# Patient Record
Sex: Female | Born: 1980 | Race: Black or African American | Hispanic: No | Marital: Single | Smoking: Former smoker
Health system: Southern US, Community
[De-identification: ages and names within clinical notes are randomized; demographics above are authoritative.]

## PROBLEM LIST (undated history)

## (undated) DIAGNOSIS — R519 Headache, unspecified: Secondary | ICD-10-CM

## (undated) DIAGNOSIS — R51 Headache: Secondary | ICD-10-CM

## (undated) DIAGNOSIS — Z8659 Personal history of other mental and behavioral disorders: Secondary | ICD-10-CM

## (undated) DIAGNOSIS — Z87442 Personal history of urinary calculi: Secondary | ICD-10-CM

## (undated) DIAGNOSIS — G5601 Carpal tunnel syndrome, right upper limb: Secondary | ICD-10-CM

## (undated) HISTORY — PX: WISDOM TOOTH EXTRACTION: SHX21

---
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
## 1999-01-04 ENCOUNTER — Other Ambulatory Visit: Admission: RE | Admit: 1999-01-04 | Discharge: 1999-01-04 | Payer: Self-pay | Admitting: Family Medicine

## 2002-08-16 ENCOUNTER — Encounter: Payer: Self-pay | Admitting: Family Medicine

## 2002-08-16 ENCOUNTER — Encounter: Admission: RE | Admit: 2002-08-16 | Discharge: 2002-08-16 | Payer: Self-pay | Admitting: Family Medicine

## 2003-07-24 ENCOUNTER — Other Ambulatory Visit: Admission: RE | Admit: 2003-07-24 | Discharge: 2003-07-24 | Payer: Self-pay | Admitting: *Deleted

## 2004-08-05 ENCOUNTER — Other Ambulatory Visit: Admission: RE | Admit: 2004-08-05 | Discharge: 2004-08-05 | Payer: Self-pay | Admitting: *Deleted

## 2005-12-15 ENCOUNTER — Other Ambulatory Visit: Admission: RE | Admit: 2005-12-15 | Discharge: 2005-12-15 | Payer: Self-pay | Admitting: Gynecology

## 2006-01-16 ENCOUNTER — Encounter: Payer: Self-pay | Admitting: Family Medicine

## 2006-05-11 ENCOUNTER — Emergency Department (HOSPITAL_COMMUNITY): Admission: EM | Admit: 2006-05-11 | Discharge: 2006-05-11 | Payer: Self-pay | Admitting: Family Medicine

## 2006-05-18 ENCOUNTER — Ambulatory Visit: Payer: Self-pay | Admitting: Family Medicine

## 2006-06-16 ENCOUNTER — Ambulatory Visit: Payer: Self-pay | Admitting: Family Medicine

## 2006-06-30 ENCOUNTER — Ambulatory Visit: Payer: Self-pay | Admitting: Family Medicine

## 2006-08-29 ENCOUNTER — Emergency Department (HOSPITAL_COMMUNITY): Admission: EM | Admit: 2006-08-29 | Discharge: 2006-08-29 | Payer: Self-pay | Admitting: Family Medicine

## 2006-12-21 ENCOUNTER — Other Ambulatory Visit: Admission: RE | Admit: 2006-12-21 | Discharge: 2006-12-21 | Payer: Self-pay | Admitting: Gynecology

## 2007-01-03 ENCOUNTER — Encounter: Payer: Self-pay | Admitting: Family Medicine

## 2007-04-10 ENCOUNTER — Emergency Department (HOSPITAL_COMMUNITY): Admission: EM | Admit: 2007-04-10 | Discharge: 2007-04-10 | Payer: Self-pay | Admitting: Psychology

## 2007-05-15 ENCOUNTER — Encounter: Payer: Self-pay | Admitting: Family Medicine

## 2007-05-15 ENCOUNTER — Ambulatory Visit: Payer: Self-pay | Admitting: Family Medicine

## 2007-05-15 DIAGNOSIS — F329 Major depressive disorder, single episode, unspecified: Secondary | ICD-10-CM

## 2007-05-15 DIAGNOSIS — F32A Depression, unspecified: Secondary | ICD-10-CM | POA: Insufficient documentation

## 2007-05-15 DIAGNOSIS — E042 Nontoxic multinodular goiter: Secondary | ICD-10-CM | POA: Insufficient documentation

## 2007-05-15 DIAGNOSIS — E079 Disorder of thyroid, unspecified: Secondary | ICD-10-CM | POA: Insufficient documentation

## 2007-05-23 ENCOUNTER — Telehealth: Payer: Self-pay | Admitting: Family Medicine

## 2007-05-23 ENCOUNTER — Encounter: Payer: Self-pay | Admitting: Family Medicine

## 2007-05-31 ENCOUNTER — Ambulatory Visit: Payer: Self-pay | Admitting: Family Medicine

## 2007-06-08 ENCOUNTER — Telehealth: Payer: Self-pay | Admitting: Family Medicine

## 2007-06-13 ENCOUNTER — Encounter: Payer: Self-pay | Admitting: Family Medicine

## 2007-06-15 ENCOUNTER — Ambulatory Visit: Payer: Self-pay | Admitting: Family Medicine

## 2007-06-19 LAB — CONVERTED CEMR LAB: Free T4: 0.9 ng/dL (ref 0.6–1.6)

## 2007-06-21 ENCOUNTER — Encounter: Payer: Self-pay | Admitting: Family Medicine

## 2007-06-29 ENCOUNTER — Ambulatory Visit: Payer: Self-pay | Admitting: Family Medicine

## 2007-07-16 ENCOUNTER — Telehealth: Payer: Self-pay | Admitting: Family Medicine

## 2007-08-02 ENCOUNTER — Ambulatory Visit: Payer: Self-pay | Admitting: Family Medicine

## 2007-08-02 DIAGNOSIS — S93409A Sprain of unspecified ligament of unspecified ankle, initial encounter: Secondary | ICD-10-CM | POA: Insufficient documentation

## 2007-08-23 ENCOUNTER — Emergency Department (HOSPITAL_COMMUNITY): Admission: EM | Admit: 2007-08-23 | Discharge: 2007-08-24 | Payer: Self-pay | Admitting: Emergency Medicine

## 2007-08-27 ENCOUNTER — Ambulatory Visit (HOSPITAL_COMMUNITY): Admission: RE | Admit: 2007-08-27 | Discharge: 2007-08-27 | Payer: Self-pay | Admitting: Urology

## 2007-12-31 ENCOUNTER — Ambulatory Visit: Payer: Self-pay | Admitting: Family Medicine

## 2008-01-07 ENCOUNTER — Telehealth: Payer: Self-pay | Admitting: Family Medicine

## 2008-01-09 ENCOUNTER — Ambulatory Visit: Payer: Self-pay | Admitting: Family Medicine

## 2008-01-24 ENCOUNTER — Ambulatory Visit: Payer: Self-pay | Admitting: Family Medicine

## 2008-02-15 ENCOUNTER — Other Ambulatory Visit: Admission: RE | Admit: 2008-02-15 | Discharge: 2008-02-15 | Payer: Self-pay | Admitting: Gynecology

## 2008-02-15 ENCOUNTER — Ambulatory Visit: Payer: Self-pay | Admitting: Women's Health

## 2008-02-15 ENCOUNTER — Encounter: Payer: Self-pay | Admitting: Women's Health

## 2008-02-22 ENCOUNTER — Ambulatory Visit: Payer: Self-pay | Admitting: Family Medicine

## 2008-10-09 ENCOUNTER — Ambulatory Visit: Payer: Self-pay | Admitting: Women's Health

## 2009-03-21 ENCOUNTER — Inpatient Hospital Stay (HOSPITAL_COMMUNITY): Admission: AD | Admit: 2009-03-21 | Discharge: 2009-03-21 | Payer: Self-pay | Admitting: Obstetrics & Gynecology

## 2009-06-02 ENCOUNTER — Inpatient Hospital Stay (HOSPITAL_COMMUNITY): Admission: AD | Admit: 2009-06-02 | Discharge: 2009-06-02 | Payer: Self-pay | Admitting: Obstetrics and Gynecology

## 2009-06-03 ENCOUNTER — Inpatient Hospital Stay (HOSPITAL_COMMUNITY): Admission: AD | Admit: 2009-06-03 | Discharge: 2009-06-05 | Payer: Self-pay | Admitting: Obstetrics & Gynecology

## 2009-06-03 ENCOUNTER — Inpatient Hospital Stay (HOSPITAL_COMMUNITY): Admission: AD | Admit: 2009-06-03 | Discharge: 2009-06-03 | Payer: Self-pay | Admitting: Obstetrics and Gynecology

## 2009-07-05 ENCOUNTER — Emergency Department (HOSPITAL_COMMUNITY): Admission: EM | Admit: 2009-07-05 | Discharge: 2009-07-05 | Payer: Self-pay | Admitting: Emergency Medicine

## 2009-07-13 ENCOUNTER — Emergency Department (HOSPITAL_COMMUNITY): Admission: EM | Admit: 2009-07-13 | Discharge: 2009-07-13 | Payer: Self-pay | Admitting: Emergency Medicine

## 2010-07-07 LAB — CBC
HCT: 36.1 % (ref 36.0–46.0)
MCHC: 33.6 g/dL (ref 30.0–36.0)
MCV: 85.7 fL (ref 78.0–100.0)
RBC: 4.22 MIL/uL (ref 3.87–5.11)

## 2010-07-07 LAB — RPR: RPR Ser Ql: NONREACTIVE

## 2010-09-03 NOTE — Assessment & Plan Note (Signed)
[facility]                           [facility] OFFICE NOTE   NAME:Smith, PEARLIE NIES                      MRN:          [id number]  DATE:05/18/2006                            DOB:          1980-10-01    CHIEF COMPLAINT:  A 30 year old black female here to establish new  doctor.   HISTORY OF PRESENT ILLNESS:  Ms. Lawler states that she is doing very  well today.  Last week when she called for the appointment, she was  experiencing pinkeye and laryngitis as well as congestion and facial  pain.  She was unable to wait for this appointment, and so was seen  [facility].  At that visit she was diagnosed  with acute  sinusitis and pinkeye.  She was given a course of Z-Pak and Vigamox eye  drops.  She is almost 100% improved now.   She has no current concerns at this point in time.  She states that she  does have a history of thyroid abnormality that was worked up and seen  by an endocrinologist without specific diagnosis for hypo and  hyperthyroidism.  She states that her thyroid hormone seems to vary, but  she is asymptomatic with it.   REVIEW OF SYSTEMS:  Otherwise negative.   PAST MEDICAL HISTORY:  1. Question thyroid disorder.  2. Irritable bowel spastic colon.   HOSPITALIZATION/SURGERIES/PROCEDURES:  None.   ALLERGIES:  PENICILLIN CAUSES SPOTS.   MEDICATIONS:  1. Depo-Provera q. 3 months.  2. Ibuprofen 800 mg p.r.n.   SOCIAL HISTORY:  Occasional tobacco use, 1 to 2 every couple of weeks.  She drinks about 2 times every month.  She denies drug use.  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  She is single, but is in a  longterm monogamous relationship of 1-1/2 years.  She is sexually  active, but uses Depo-Provera for birth control and condoms.  She does  not have children.  She does not get any regular exercise.  She avoids  fast food.  She eats grilled chicken, fruits, vegetables and lots of  water.  She occasionally  has soda.   FAMILY HISTORY:  Father alive at age 19 with hypertension and  prediabetes.  Mother alive at age 68 with fibroids and possible uterine  cancer or precancer changes as well as spastic colon.  No MI in family  before age 11.  She has 2 brothers who are healthy.  No family history  of other diabetes or cancer.   PHYSICAL EXAMINATION:  VITAL SIGNS:  Height 65 inches, weight 173,  making BMI 29.  Blood pressure 110/68.  Pulse 80.  Temperature 97.9.  GENERAL:  Overweight-appearing female, in no apparent distress.  HEENT:  PERRLA.  Extraocular muscles intact.  Oropharynx clear.  Tympanic membranes clear.  Nares clear.  Possible mild diffuse homogenous thyroid enlargement bilaterally.  No  lymphadenopathy.  LUNGS:  Clear to auscultation bilaterally.  No wheezes, rales or  rhonchi.  ABDOMEN:  Soft and non-tender with normoactive bowel sounds.  No  hepatosplenomegaly.  MUSCULOSKELETAL:  Strength 5/5 in upper  and lower extremities.  NEUROLOGIC:  Alert and oriented x3.  Cranial nerves 2 through 12 grossly  intact.   ASSESSMENT AND PLAN:  1. Acute sinusitis and pinkeye, resolved.  2. Complete physical exam.  She is up to date with most of her      prevention.  We discussed routine exercise and a healthy diet.  I      encouraged her to take a multivitamin, calcium and vitamin D daily.      I will obtain records from her previous doctor to look into her      thyroid disorder to determine if followup with labs needs to be      done.  Is also is unclear as to whether she has had cholesterol      panel baseline in the past.     Kerby Nora, MD  Electronically Signed    AB/MedQ  DD: 05/18/2006  DT: 05/18/2006  Job #: [id number]

## 2012-08-17 ENCOUNTER — Encounter: Payer: Self-pay | Admitting: Family Medicine

## 2012-08-17 ENCOUNTER — Ambulatory Visit (INDEPENDENT_AMBULATORY_CARE_PROVIDER_SITE_OTHER): Payer: PRIVATE HEALTH INSURANCE | Admitting: Family Medicine

## 2012-08-17 VITALS — BP 120/72 | HR 82 | Temp 98.7°F | Ht 65.0 in | Wt 211.5 lb

## 2012-08-17 DIAGNOSIS — R079 Chest pain, unspecified: Secondary | ICD-10-CM

## 2012-08-17 DIAGNOSIS — R002 Palpitations: Secondary | ICD-10-CM

## 2012-08-17 DIAGNOSIS — M25551 Pain in right hip: Secondary | ICD-10-CM | POA: Insufficient documentation

## 2012-08-17 DIAGNOSIS — F410 Panic disorder [episodic paroxysmal anxiety] without agoraphobia: Secondary | ICD-10-CM

## 2012-08-17 LAB — CBC WITH DIFFERENTIAL/PLATELET
Eosinophils Absolute: 0.1 10*3/uL (ref 0.0–0.7)
Eosinophils Relative: 1 % (ref 0–5)
HCT: 41 % (ref 36.0–46.0)
Hemoglobin: 13.7 g/dL (ref 12.0–15.0)
Lymphocytes Relative: 36 % (ref 12–46)
Lymphs Abs: 2.8 10*3/uL (ref 0.7–4.0)
MCH: 27.9 pg (ref 26.0–34.0)
MCV: 83.5 fL (ref 78.0–100.0)
Monocytes Absolute: 0.5 10*3/uL (ref 0.1–1.0)
Monocytes Relative: 6 % (ref 3–12)
Platelets: 381 10*3/uL (ref 150–400)
RBC: 4.91 MIL/uL (ref 3.87–5.11)

## 2012-08-17 NOTE — Assessment & Plan Note (Signed)
Poor control. If labs negative consider SSRI for treatment vs anxiolytic for specific panic attacks

## 2012-08-17 NOTE — Assessment & Plan Note (Signed)
Eval for anemia, hyperthyroidism. Decrease caffeine, stress etc.

## 2012-08-17 NOTE — Patient Instructions (Addendum)
Stop by lab on your way out. We will call you for further recommendations. Work on stress reduction and relaxation.  Remove caffeine from diet. Exercise 3-5 times a week.

## 2012-08-17 NOTE — Assessment & Plan Note (Addendum)
EKG: no concerning changes. Cardiac source unlikely.  Panic disorder +/- costochondritis possible.

## 2012-08-17 NOTE — Addendum Note (Signed)
Addended by: Alvina Chou on: 08/17/2012 03:36 PM   Modules accepted: Orders

## 2012-08-17 NOTE — Progress Notes (Signed)
  Subjective:    Patient ID: [name], female    DOB: 05-19-1980, 32 y.o.   MRN: [id number]  HPI  32 year old female with history of depression and anxiety including panic attacks presents to clinic today with worsening panic attacks. She is having panic attacks occuring weekly. They are more intense then in the past in the past 3-4 months. Last one was 4 days ago: she feels shaky heart racing, chest sharp pain and shortness of breath.  She remarks she is concerned about the severe intensity increasing of panic attacks and chest pain.  If she takes deep breaths and lying down.Marland Kitchen Resolves after 30-40 min. The following day she notes chest soreness for a day or two.  Increase in stress in last year with grandmother sick. Has a three year old.    She is currently on no medication. In past she has been on something that she cannot remember the medication.  Occasionally feels shaky.  Family history: GM with CHF. Brother and grave's disease.   Review of Systems  Constitutional: Negative for fever and fatigue.  HENT: Negative for ear pain.   Eyes: Negative for pain.  Respiratory: Negative for chest tightness and shortness of breath.   Cardiovascular: Negative for chest pain, palpitations and leg swelling.  Gastrointestinal: Negative for abdominal pain.  Genitourinary: Negative for dysuria.       Objective:   Physical Exam  Constitutional: Vital signs are normal. She appears well-developed and well-nourished. She is cooperative.  Non-toxic appearance. She does not appear ill. No distress.  HENT:  Head: Normocephalic.  Right Ear: Hearing, tympanic membrane, external ear and ear canal normal. Tympanic membrane is not erythematous, not retracted and not bulging.  Left Ear: Hearing, tympanic membrane, external ear and ear canal normal. Tympanic membrane is not erythematous, not retracted and not bulging.  Nose: No mucosal edema or rhinorrhea. Right sinus exhibits no maxillary  sinus tenderness and no frontal sinus tenderness. Left sinus exhibits no maxillary sinus tenderness and no frontal sinus tenderness.  Mouth/Throat: Uvula is midline, oropharynx is clear and moist and mucous membranes are normal.  Eyes: Conjunctivae, EOM and lids are normal. Pupils are equal, round, and reactive to light. No foreign bodies found.  Neck: Trachea normal and normal range of motion. Neck supple. Carotid bruit is not present. No mass and no thyromegaly present.  Cardiovascular: Normal rate, regular rhythm, S1 normal, S2 normal, normal heart sounds, intact distal pulses and normal pulses.  Exam reveals no gallop and no friction rub.   No murmur heard. Pulmonary/Chest: Effort normal and breath sounds normal. Not tachypneic. No respiratory distress. She has no decreased breath sounds. She has no wheezes. She has no rhonchi. She has no rales. Chest wall is not dull to percussion. She exhibits mass and tenderness. She exhibits no laceration and no crepitus.    Abdominal: Soft. Normal appearance and bowel sounds are normal. There is no tenderness.  Neurological: She is alert.  Skin: Skin is warm, dry and intact. No rash noted.  Psychiatric: Her speech is normal and behavior is normal. Judgment and thought content normal. Her mood appears not anxious. Cognition and memory are normal. She does not exhibit a depressed mood.          Assessment & Plan:

## 2012-08-21 ENCOUNTER — Ambulatory Visit: Payer: Self-pay | Admitting: Family Medicine

## 2012-08-24 ENCOUNTER — Encounter: Payer: Self-pay | Admitting: Family Medicine

## 2012-08-24 ENCOUNTER — Ambulatory Visit (INDEPENDENT_AMBULATORY_CARE_PROVIDER_SITE_OTHER): Payer: PRIVATE HEALTH INSURANCE | Admitting: Family Medicine

## 2012-08-24 VITALS — BP 120/70 | HR 95 | Temp 98.2°F | Ht 65.0 in | Wt 213.8 lb

## 2012-08-24 DIAGNOSIS — E079 Disorder of thyroid, unspecified: Secondary | ICD-10-CM

## 2012-08-24 DIAGNOSIS — R519 Headache, unspecified: Secondary | ICD-10-CM | POA: Insufficient documentation

## 2012-08-24 DIAGNOSIS — R51 Headache: Secondary | ICD-10-CM

## 2012-08-24 DIAGNOSIS — F410 Panic disorder [episodic paroxysmal anxiety] without agoraphobia: Secondary | ICD-10-CM

## 2012-08-24 NOTE — Patient Instructions (Addendum)
Ibuprofen for headache. We will call with lab results.... Stop at lab on way out. Work on stress reduction and relaxation.

## 2012-08-24 NOTE — Assessment & Plan Note (Signed)
Improving with stress reduction and relaxation.

## 2012-08-24 NOTE — Assessment & Plan Note (Signed)
Some qualities of tension headahce and some of migraine.  Nml neuro exam and no red flags other that new headache severe at times.  May be cause by glasses vs thyroid issue.  Can use ibuprofen prn.

## 2012-08-24 NOTE — Assessment & Plan Note (Addendum)
TSH suggests hyperthyroid.Marland KitchenMarland KitchenMay be cause of symptoms. Eval with free t3 and t4 to verify.  Addendum: thyroid tests returned nml. WIll treat with SSRI for mood disorder.

## 2012-08-24 NOTE — Progress Notes (Signed)
Subjective:    Patient ID: [name], female    DOB: 03-08-1981, 32 y.o.   MRN: [id number]  HPI  32 year old female presetns to re-establish.  Her last CPX was with [facility])  last year... No history of abnormal pap  Of note: She was seen on 5/2 for palpitations and panic attacks. TSH was low and she needs free T3 and free T4 checked. CBC was negative. She has been doing better with panic in last week... Deep breathing helping.   She has been having headaches.. Has recently gotten glasses for the first time.  Frontal, squeezing... In last week, tried not wearing glasses (no help).  Associated with nausea, photo phonophobia. Needs to lie down.  Using 4 tabs of ibuprofen.  Never had migraines in past.  History   Social History  . Marital Status: Single    Spouse Name: N/A    Number of Children: 1  . Years of Education: N/A   Occupational History  . [employer] sales employee    Social History Main Topics  . Smoking status: Never Smoker   . Smokeless tobacco: None  . Alcohol Use: 0.5 oz/week    1 drink(s) per week  . Drug Use: No  . Sexually Active: No   Other Topics Concern  . None   Social History Narrative   Single   Regular exercise  2 days a week   Healthy eating.            Review of Systems  Constitutional: Negative for fever and fatigue.  HENT: Negative for ear pain.   Eyes: Negative for pain.  Respiratory: Negative for chest tightness and shortness of breath.   Cardiovascular: Negative for chest pain, palpitations and leg swelling.  Gastrointestinal: Positive for nausea. Negative for abdominal pain.  Genitourinary: Negative for dysuria.  Neurological: Positive for headaches. Negative for dizziness, syncope, facial asymmetry, light-headedness and numbness.       Objective:   Physical Exam  Constitutional: She is oriented to person, place, and time. Vital signs are normal. She appears well-developed and well-nourished. She is  cooperative.  Non-toxic appearance. She does not appear ill. No distress.  HENT:  Head: Normocephalic.  Right Ear: Hearing, tympanic membrane, external ear and ear canal normal.  Left Ear: Hearing, tympanic membrane, external ear and ear canal normal.  Nose: Nose normal.  Eyes: Conjunctivae, EOM and lids are normal. Pupils are equal, round, and reactive to light. No foreign bodies found.  Neck: Trachea normal and normal range of motion. Neck supple. Carotid bruit is not present. No mass and no thyromegaly present.  Cardiovascular: Normal rate, regular rhythm, S1 normal, S2 normal, normal heart sounds and intact distal pulses.  Exam reveals no gallop.   No murmur heard. Pulmonary/Chest: Effort normal and breath sounds normal. No respiratory distress. She has no wheezes. She has no rhonchi. She has no rales.  Abdominal: Soft. Normal appearance and bowel sounds are normal. She exhibits no distension, no fluid wave, no abdominal bruit and no mass. There is no hepatosplenomegaly. There is no tenderness. There is no rebound, no guarding and no CVA tenderness. No hernia.  Lymphadenopathy:    She has no cervical adenopathy.    She has no axillary adenopathy.  Neurological: She is alert and oriented to person, place, and time. She has normal strength and normal reflexes. No cranial nerve deficit or sensory deficit. She displays a negative Romberg sign. Gait normal.  Skin: Skin is warm, dry  and intact. No rash noted.  Psychiatric: Her speech is normal and behavior is normal. Judgment normal. Her mood appears not anxious. Cognition and memory are normal. She does not exhibit a depressed mood.          Assessment & Plan:

## 2012-08-28 ENCOUNTER — Telehealth: Payer: Self-pay | Admitting: Family Medicine

## 2012-08-28 MED ORDER — SERTRALINE HCL 25 MG PO TABS: 25.0000 mg | ORAL_TABLET | Freq: Every day | ORAL | Status: DC

## 2012-08-28 NOTE — Telephone Encounter (Signed)
Message copied by Excell Seltzer on Tue Aug 28, 2012  8:51 AM ------      Message from: Consuello Masse      Created: Mon Aug 27, 2012 11:31 AM       PATIENT ADVISED AND AGREEABLE TO STARTING MEDICATION AND USES [facility] [address] ------

## 2012-08-28 NOTE — Telephone Encounter (Signed)
Start sertraline 25 mg at bedtime. Started low dose... Takes 3-4 weeks for it to be effective.  Follow up in 1 month. At that point if not better.. We can increase dose.

## 2012-08-28 NOTE — Telephone Encounter (Signed)
Patient advised.

## 2012-09-03 NOTE — Telephone Encounter (Signed)
Pt said med was not called to [facility] [address]; apologized to pt. Spoke with Sam pharmacist at [address] and gave verbal order as instructed for Sertraline. Reviewed 08/28/12 note with pt and pt will call back for appt.

## 2012-10-02 ENCOUNTER — Telehealth: Payer: Self-pay

## 2012-10-02 NOTE — Telephone Encounter (Signed)
At work on 09/29/12 and glass scale fell out of box pt was carrying; hitting rt ankle. Pt has iced ankle with elevation but still painful. Cannot bear full weight on rt ankle and wants to be seen today. No available appts. Dr Ermalene Searing said could schedule appt 10/03/12 but pt said does not want to wait until tomorrow, pt to go to [facility].

## 2012-10-02 NOTE — Telephone Encounter (Signed)
Agreed -

## 2012-10-04 ENCOUNTER — Ambulatory Visit (INDEPENDENT_AMBULATORY_CARE_PROVIDER_SITE_OTHER)
Admission: RE | Admit: 2012-10-04 | Discharge: 2012-10-04 | Disposition: A | Discharge status: Home / Self Care | Payer: PRIVATE HEALTH INSURANCE | Source: Ambulatory Visit | Attending: Family Medicine | Admitting: Family Medicine

## 2012-10-04 ENCOUNTER — Encounter: Payer: Self-pay | Admitting: Family Medicine

## 2012-10-04 ENCOUNTER — Ambulatory Visit (INDEPENDENT_AMBULATORY_CARE_PROVIDER_SITE_OTHER): Payer: PRIVATE HEALTH INSURANCE | Admitting: Family Medicine

## 2012-10-04 VITALS — BP 110/70 | HR 71 | Temp 98.3°F | Wt 216.0 lb

## 2012-10-04 DIAGNOSIS — S8990XA Unspecified injury of unspecified lower leg, initial encounter: Secondary | ICD-10-CM

## 2012-10-04 DIAGNOSIS — S99911A Unspecified injury of right ankle, initial encounter: Secondary | ICD-10-CM

## 2012-10-04 NOTE — Assessment & Plan Note (Addendum)
RICE, NSAIDS. Info given. No weight bearing x 1 week, use crutches.  Given trauma.. Will eval with X-ray but expect severe bruise only.

## 2012-10-04 NOTE — Progress Notes (Signed)
  Subjective:    Patient ID: [name], female    DOB: Sep 10, 1980, 32 y.o.   MRN: [id number]  HPI 32 year old female presents  Following injury to right ankle. She had a glass scale hit her lateral ankle 6 days ago. Noted immediate pain and numbness after injury, was unable to weight bear. She has been elevating, icing ankle and using crutches. Using ibuprofen 400 mg as needed for pain. Occurred at work.  Noted bruising and redness on lateral ankle. Still pain with weight bearing. Tingling and  Pain shooting through it when she places it down on the ground.  She has not ben back to work because she cannot stand.. Job is behind a Ambulance person.     Review of Systems  Constitutional: Negative for fever and fatigue.  HENT: Negative for ear pain.   Eyes: Negative for pain.  Respiratory: Negative for chest tightness and shortness of breath.   Cardiovascular: Negative for chest pain, palpitations and leg swelling.  Gastrointestinal: Negative for abdominal pain.  Genitourinary: Negative for dysuria.       Objective:   Physical Exam  Constitutional: Vital signs are normal. She appears well-developed and well-nourished. She is cooperative.  Non-toxic appearance. She does not appear ill. No distress.  HENT:  Head: Normocephalic.  Right Ear: Hearing, tympanic membrane, external ear and ear canal normal. Tympanic membrane is not erythematous, not retracted and not bulging.  Left Ear: Hearing, tympanic membrane, external ear and ear canal normal. Tympanic membrane is not erythematous, not retracted and not bulging.  Nose: No mucosal edema or rhinorrhea. Right sinus exhibits no maxillary sinus tenderness and no frontal sinus tenderness. Left sinus exhibits no maxillary sinus tenderness and no frontal sinus tenderness.  Mouth/Throat: Uvula is midline, oropharynx is clear and moist and mucous membranes are normal.  Eyes: Conjunctivae, EOM and lids are normal. Pupils are equal, round, and  reactive to light. No foreign bodies found.  Neck: Trachea normal and normal range of motion. Neck supple. Carotid bruit is not present. No mass and no thyromegaly present.  Cardiovascular: Normal rate, regular rhythm, S1 normal, S2 normal, normal heart sounds, intact distal pulses and normal pulses.  Exam reveals no gallop and no friction rub.   No murmur heard. Pulmonary/Chest: Effort normal and breath sounds normal. Not tachypneic. No respiratory distress. She has no decreased breath sounds. She has no wheezes. She has no rhonchi. She has no rales.  Abdominal: Soft. Normal appearance and bowel sounds are normal. There is no tenderness.  Musculoskeletal:  Swelling and bruising over lateral malleolus... ttp over entire lateral ankle  Decrease ROM due to pain.  Neurological: She is alert.  Skin: Skin is warm, dry and intact. No rash noted.  Psychiatric: Her speech is normal and behavior is normal. Judgment and thought content normal. Her mood appears not anxious. Cognition and memory are normal. She does not exhibit a depressed mood.          Assessment & Plan:

## 2012-10-04 NOTE — Patient Instructions (Addendum)
We will call with X-ray results. Elevate, ice, gentle stretching exercises as tolerated. Can use ibuprofen 800 mg three times a day for pain and swelling. Limit weight bearing. No prolongued standing. Do not return to work unless able to do a different job sitting with foot elevated x 1 week. Follow up in 2 weeks.

## 2012-10-04 NOTE — Addendum Note (Signed)
Addended by: Kerby Nora E on: 10/04/2012 10:59 AM   Modules accepted: Orders

## 2012-10-09 ENCOUNTER — Telehealth: Payer: Self-pay

## 2012-10-09 NOTE — Telephone Encounter (Signed)
I think by now she should be able to move to ace bandage and apply pressure.   She should see workers comp MD.

## 2012-10-09 NOTE — Telephone Encounter (Signed)
Pt was seen 10/04/12 pt returned to work on 10/07/12; pt said work knew her restrictions; foot was painful. Pt said workers comp advised pt to go to worker comp dr at an UC today; pt was seen at [facility] and was pt was given ace bandage and to apply pressure to foot.  Pt wants Dr Daphine Deutscher opinion of what she should do. Pt said she has to see workers comp dr but pt still wants Dr Daphine Deutscher opinion. [facility] [address] Please advise.

## 2012-10-10 NOTE — Telephone Encounter (Signed)
Patient advised.

## 2013-07-21 ENCOUNTER — Encounter (HOSPITAL_COMMUNITY): Payer: Self-pay | Admitting: Emergency Medicine

## 2013-07-21 ENCOUNTER — Emergency Department (HOSPITAL_COMMUNITY): Payer: No Typology Code available for payment source

## 2013-07-21 ENCOUNTER — Emergency Department (HOSPITAL_COMMUNITY)
Admission: EM | Admit: 2013-07-21 | Discharge: 2013-07-21 | Disposition: A | Discharge status: Home / Self Care | Payer: No Typology Code available for payment source | Attending: Emergency Medicine | Admitting: Emergency Medicine

## 2013-07-21 DIAGNOSIS — Z862 Personal history of diseases of the blood and blood-forming organs and certain disorders involving the immune mechanism: Secondary | ICD-10-CM | POA: Insufficient documentation

## 2013-07-21 DIAGNOSIS — IMO0002 Reserved for concepts with insufficient information to code with codable children: Secondary | ICD-10-CM | POA: Insufficient documentation

## 2013-07-21 DIAGNOSIS — Z880 Allergy status to penicillin: Secondary | ICD-10-CM | POA: Insufficient documentation

## 2013-07-21 DIAGNOSIS — Y92410 Unspecified street and highway as the place of occurrence of the external cause: Secondary | ICD-10-CM | POA: Insufficient documentation

## 2013-07-21 DIAGNOSIS — Y9389 Activity, other specified: Secondary | ICD-10-CM | POA: Insufficient documentation

## 2013-07-21 DIAGNOSIS — Z8639 Personal history of other endocrine, nutritional and metabolic disease: Secondary | ICD-10-CM | POA: Insufficient documentation

## 2013-07-21 DIAGNOSIS — S20219A Contusion of unspecified front wall of thorax, initial encounter: Secondary | ICD-10-CM | POA: Insufficient documentation

## 2013-07-21 DIAGNOSIS — S93409A Sprain of unspecified ligament of unspecified ankle, initial encounter: Secondary | ICD-10-CM | POA: Insufficient documentation

## 2013-07-21 DIAGNOSIS — S93401A Sprain of unspecified ligament of right ankle, initial encounter: Secondary | ICD-10-CM

## 2013-07-21 DIAGNOSIS — S62619A Displaced fracture of proximal phalanx of unspecified finger, initial encounter for closed fracture: Secondary | ICD-10-CM

## 2013-07-21 MED ORDER — IBUPROFEN 600 MG PO TABS: 600.0000 mg | ORAL_TABLET | Freq: Four times a day (QID) | ORAL | Status: DC | PRN

## 2013-07-21 MED ORDER — IBUPROFEN 400 MG PO TABS
600.0000 mg | ORAL_TABLET | Freq: Once | ORAL | Status: AC
  Administered 2013-07-21: 600 mg via ORAL
  Filled 2013-07-21 (×2): qty 1

## 2013-07-21 MED ORDER — HYDROCODONE-ACETAMINOPHEN 5-325 MG PO TABS
1.0000 | ORAL_TABLET | Freq: Once | ORAL | Status: AC
  Administered 2013-07-21: 1 via ORAL
  Filled 2013-07-21: qty 1

## 2013-07-21 MED ORDER — HYDROCODONE-ACETAMINOPHEN 5-325 MG PO TABS: 1.0000 | ORAL_TABLET | ORAL | Status: DC | PRN

## 2013-07-21 NOTE — Discharge Instructions (Signed)
Ankle Sprain °An ankle sprain is an injury to the strong, fibrous tissues (ligaments) that hold the bones of your ankle joint together.  °CAUSES °An ankle sprain is usually caused by a fall or by twisting your ankle. Ankle sprains most commonly occur when you step on the outer edge of your foot, and your ankle turns inward. People who participate in sports are more prone to these types of injuries.  °SYMPTOMS  °· Pain in your ankle. The pain may be present at rest or only when you are trying to stand or walk. °· Swelling. °· Bruising. Bruising may develop immediately or within 1 to 2 days after your injury. °· Difficulty standing or walking, particularly when turning corners or changing directions. °DIAGNOSIS  °Your caregiver will ask you details about your injury and perform a physical exam of your ankle to determine if you have an ankle sprain. During the physical exam, your caregiver will press on and apply pressure to specific areas of your foot and ankle. Your caregiver will try to move your ankle in certain ways. An X-ray exam may be done to be sure a bone was not broken or a ligament did not separate from one of the bones in your ankle (avulsion fracture).  °TREATMENT  °Certain types of braces can help stabilize your ankle. Your caregiver can make a recommendation for this. Your caregiver may recommend the use of medicine for pain. If your sprain is severe, your caregiver may refer you to a surgeon who helps to restore function to parts of your skeletal system (orthopedist) or a physical therapist. °HOME CARE INSTRUCTIONS  °· Apply ice to your injury for 1 2 days or as directed by your caregiver. Applying ice helps to reduce inflammation and pain. °· Put ice in a plastic bag. °· Place a towel between your skin and the bag. °· Leave the ice on for 15-20 minutes at a time, every 2 hours while you are awake. °· Only take over-the-counter or prescription medicines for pain, discomfort, or fever as directed by  your caregiver. °· Elevate your injured ankle above the level of your heart as much as possible for 2 3 days. °· If your caregiver recommends crutches, use them as instructed. Gradually put weight on the affected ankle. Continue to use crutches or a cane until you can walk without feeling pain in your ankle. °· If you have a plaster splint, wear the splint as directed by your caregiver. Do not rest it on anything harder than a pillow for the first 24 hours. Do not put weight on it. Do not get it wet. You may take it off to take a shower or bath. °· You may have been given an elastic bandage to wear around your ankle to provide support. If the elastic bandage is too tight (you have numbness or tingling in your foot or your foot becomes cold and blue), adjust the bandage to make it comfortable. °· If you have an air splint, you may blow more air into it or let air out to make it more comfortable. You may take your splint off at night and before taking a shower or bath. Wiggle your toes in the splint several times per day to decrease swelling. °SEEK MEDICAL CARE IF:  °· You have rapidly increasing bruising or swelling. °· Your toes feel extremely cold or you lose feeling in your foot. °· Your pain is not relieved with medicine. °SEEK IMMEDIATE MEDICAL CARE IF: °· Your toes are numb   or blue.  You have severe pain that is increasing. MAKE SURE YOU:   Understand these instructions.  Will watch your condition.  Will get help right away if you are not doing well or get worse. Document Released: 04/04/2005 Document Revised: 12/28/2011 Document Reviewed: 04/16/2011 [facility] Patient Information 2014 [place], [place].  Chest Contusion A chest contusion is a deep bruise on your chest area. Contusions are the result of an injury that caused bleeding under the skin. A chest contusion may involve bruising of the skin, muscles, or ribs. The contusion may turn blue, purple, or yellow. Minor injuries will give you a  painless contusion, but more severe contusions may stay painful and swollen for a few weeks. CAUSES  A contusion is usually caused by a blow, trauma, or direct force to an area of the body. SYMPTOMS   Swelling and redness of the injured area.  Discoloration of the injured area.  Tenderness and soreness of the injured area.  Pain. DIAGNOSIS  The diagnosis can be made by taking a history and performing a physical exam. An X-ray, CT scan, or MRI may be needed to determine if there were any associated injuries, such as broken bones (fractures) or internal injuries. TREATMENT  Often, the best treatment for a chest contusion is resting, icing, and applying cold compresses to the injured area. Deep breathing exercises may be recommended to reduce the risk of pneumonia. Over-the-counter medicines may also be recommended for pain control. HOME CARE INSTRUCTIONS   Put ice on the injured area.  Put ice in a plastic bag.  Place a towel between your skin and the bag.  Leave the ice on for 15-20 minutes, 03-04 times a day.  Only take over-the-counter or prescription medicines as directed by your caregiver. Your caregiver may recommend avoiding anti-inflammatory medicines (aspirin, ibuprofen, and naproxen) for 48 hours because these medicines may increase bruising.  Rest the injured area.  Perform deep-breathing exercises as directed by your caregiver.  Stop smoking if you smoke.  Do not lift objects over 5 pounds (2.3 kg) for 3 days or longer if recommended by your caregiver. SEEK IMMEDIATE MEDICAL CARE IF:   You have increased bruising or swelling.  You have pain that is getting worse.  You have difficulty breathing.  You have dizziness, weakness, or fainting.  You have blood in your urine or stool.  You cough up or vomit blood.  Your swelling or pain is not relieved with medicines. MAKE SURE YOU:   Understand these instructions.  Will watch your condition.  Will get help  right away if you are not doing well or get worse. Document Released: 12/28/2000 Document Revised: 12/28/2011 Document Reviewed: 09/26/2011 [facility] Patient Information 2014 [place], [place].  Finger Fracture Fractures of fingers are breaks in the bones of the fingers. There are many types of fractures. There are different ways of treating these fractures. Your health care provider will discuss the best way to treat your fracture. CAUSES Traumatic injury is the main cause of broken fingers. These include:  Injuries while playing sports.  Workplace injuries.  Falls. RISK FACTORS Activities that can increase your risk of finger fractures include:  Sports.  Workplace activities that involve machinery.  A condition called osteoporosis, which can make your bones less dense and cause them to fracture more easily. SIGNS AND SYMPTOMS The main symptoms of a broken finger are pain and swelling within 15 minutes after the injury. Other symptoms include:  Bruising of your finger.  Stiffness of your  finger.  Numbness of your finger.  Exposed bones (compound fracture) if the fracture is severe. DIAGNOSIS  The best way to diagnose a broken bone is with X-ray imaging. Additionally, your health care provider will use this X-ray image to evaluate the position of the broken finger bones.  TREATMENT  Finger fractures can be treated with:   Nonreduction This means the bones are in place. The finger is splinted without changing the positions of the bone pieces. The splint is usually left on for about a week to 10 days. This will depend on your fracture and what your health care provider thinks.  Closed reduction The bones are put back into position without using surgery. The finger is then splinted.  Open reduction and internal fixation The fracture site is opened. Then the bone pieces are fixed into place with pins or some type of hardware. This is seldom required. It depends on the severity of  the fracture. HOME CARE INSTRUCTIONS   Follow your health care provider's instructions regarding activities, exercises, and physical therapy.  Only take over-the-counter or prescription medicines for pain, discomfort, or fever as directed by your health care provider. SEEK MEDICAL CARE IF: You have pain or swelling that limits the motion or use of your fingers. SEEK IMMEDIATE MEDICAL CARE IF:  Your finger becomes numb. MAKE SURE YOU:   Understand these instructions.  Will watch your condition.  Will get help right away if you are not doing well or get worse. Document Released: 07/17/2000 Document Revised: 01/23/2013 Document Reviewed: 11/14/2012 [facility] Patient Information 2014 [place], [place].

## 2013-07-21 NOTE — ED Notes (Signed)
Ortho tech returned page 
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
## 2013-07-21 NOTE — ED Notes (Signed)
Ortho Tech paged.
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
## 2013-07-24 ENCOUNTER — Ambulatory Visit (INDEPENDENT_AMBULATORY_CARE_PROVIDER_SITE_OTHER): Payer: 59 | Admitting: Family Medicine

## 2013-07-24 ENCOUNTER — Encounter: Payer: Self-pay | Admitting: Family Medicine

## 2013-07-24 DIAGNOSIS — S62646A Nondisplaced fracture of proximal phalanx of right little finger, initial encounter for closed fracture: Secondary | ICD-10-CM

## 2013-07-24 DIAGNOSIS — R079 Chest pain, unspecified: Secondary | ICD-10-CM

## 2013-07-24 DIAGNOSIS — S20219A Contusion of unspecified front wall of thorax, initial encounter: Secondary | ICD-10-CM

## 2013-07-24 DIAGNOSIS — S93401A Sprain of unspecified ligament of right ankle, initial encounter: Secondary | ICD-10-CM

## 2013-07-24 DIAGNOSIS — R0781 Pleurodynia: Secondary | ICD-10-CM

## 2013-07-24 DIAGNOSIS — IMO0002 Reserved for concepts with insufficient information to code with codable children: Secondary | ICD-10-CM

## 2013-07-24 DIAGNOSIS — S93409A Sprain of unspecified ligament of unspecified ankle, initial encounter: Secondary | ICD-10-CM

## 2013-07-24 MED ORDER — HYDROCODONE-ACETAMINOPHEN 5-325 MG PO TABS: 1.0000 | ORAL_TABLET | ORAL | Status: DC | PRN

## 2013-07-24 NOTE — Progress Notes (Signed)
Pre visit review using our clinic review tool, if applicable. No additional management support is needed unless otherwise documented below in the visit note. 
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
## 2013-07-31 ENCOUNTER — Telehealth: Payer: Self-pay | Admitting: Family Medicine

## 2013-07-31 ENCOUNTER — Encounter (HOSPITAL_COMMUNITY): Payer: Self-pay | Admitting: Emergency Medicine

## 2013-07-31 ENCOUNTER — Emergency Department (HOSPITAL_COMMUNITY): Payer: 59

## 2013-07-31 ENCOUNTER — Emergency Department (HOSPITAL_COMMUNITY)
Admission: EM | Admit: 2013-07-31 | Discharge: 2013-07-31 | Disposition: A | Discharge status: Home / Self Care | Payer: 59 | Attending: Emergency Medicine | Admitting: Emergency Medicine

## 2013-07-31 DIAGNOSIS — Z8639 Personal history of other endocrine, nutritional and metabolic disease: Secondary | ICD-10-CM | POA: Insufficient documentation

## 2013-07-31 DIAGNOSIS — Z862 Personal history of diseases of the blood and blood-forming organs and certain disorders involving the immune mechanism: Secondary | ICD-10-CM | POA: Insufficient documentation

## 2013-07-31 DIAGNOSIS — Z880 Allergy status to penicillin: Secondary | ICD-10-CM | POA: Insufficient documentation

## 2013-07-31 DIAGNOSIS — Z3202 Encounter for pregnancy test, result negative: Secondary | ICD-10-CM | POA: Insufficient documentation

## 2013-07-31 DIAGNOSIS — R079 Chest pain, unspecified: Secondary | ICD-10-CM | POA: Insufficient documentation

## 2013-07-31 LAB — CBC WITH DIFFERENTIAL/PLATELET
Basophils Absolute: 0 10*3/uL (ref 0.0–0.1)
Basophils Relative: 0 % (ref 0–1)
Eosinophils Absolute: 0.1 10*3/uL (ref 0.0–0.7)
Eosinophils Relative: 1 % (ref 0–5)
HCT: 40.2 % (ref 36.0–46.0)
Hemoglobin: 13.2 g/dL (ref 12.0–15.0)
Lymphocytes Relative: 50 % — ABNORMAL HIGH (ref 12–46)
Lymphs Abs: 4 10*3/uL (ref 0.7–4.0)
MCH: 29 pg (ref 26.0–34.0)
MCHC: 32.8 g/dL (ref 30.0–36.0)
MCV: 88.4 fL (ref 78.0–100.0)
Monocytes Absolute: 0.7 10*3/uL (ref 0.1–1.0)
Monocytes Relative: 8 % (ref 3–12)
Neutro Abs: 3.3 10*3/uL (ref 1.7–7.7)
Neutrophils Relative %: 41 % — ABNORMAL LOW (ref 43–77)
Platelets: 280 10*3/uL (ref 150–400)
RBC: 4.55 MIL/uL (ref 3.87–5.11)
RDW: 13.9 % (ref 11.5–15.5)
WBC: 8 10*3/uL (ref 4.0–10.5)

## 2013-07-31 LAB — PREGNANCY, URINE: Preg Test, Ur: NEGATIVE

## 2013-07-31 LAB — BASIC METABOLIC PANEL
BUN: 8 mg/dL (ref 6–23)
CO2: 22 mEq/L (ref 19–32)
Calcium: 8.5 mg/dL (ref 8.4–10.5)
Chloride: 102 mEq/L (ref 96–112)
Creatinine, Ser: 0.58 mg/dL (ref 0.50–1.10)
GFR calc Af Amer: >90 mL/min (ref >90)
GFR calc non Af Amer: >90 mL/min (ref >90)
Glucose, Bld: 83 mg/dL (ref 70–99)
Potassium: 4.7 mEq/L (ref 3.7–5.3)
Sodium: 138 mEq/L (ref 137–147)

## 2013-07-31 LAB — D-DIMER, QUANTITATIVE (NOT AT ARMC): D-Dimer, Quant: 1.17 ug/mL-FEU — ABNORMAL HIGH (ref 0.00–0.48)

## 2013-07-31 MED ORDER — HYDROCODONE-ACETAMINOPHEN 5-325 MG PO TABS: 1.0000 | ORAL_TABLET | ORAL | Status: DC | PRN

## 2013-07-31 MED ORDER — IOHEXOL 350 MG/ML SOLN
100.0000 mL | Freq: Once | INTRAVENOUS | Status: AC | PRN
  Administered 2013-07-31: 100 mL via INTRAVENOUS

## 2013-07-31 MED ORDER — KETOROLAC TROMETHAMINE 15 MG/ML IJ SOLN
15.0000 mg | Freq: Once | INTRAMUSCULAR | Status: AC
  Administered 2013-07-31: 15 mg via INTRAVENOUS
  Filled 2013-07-31: qty 1

## 2013-07-31 NOTE — Discharge Instructions (Signed)
Chest Pain (Nonspecific) °It is often hard to give a specific diagnosis for the cause of chest pain. There is always a chance that your pain could be related to something serious, such as a heart attack or a blood clot in the lungs. You need to follow up with your caregiver for further evaluation. °CAUSES  °· Heartburn. °· Pneumonia or bronchitis. °· Anxiety or stress. °· Inflammation around your heart (pericarditis) or lung (pleuritis or pleurisy). °· A blood clot in the lung. °· A collapsed lung (pneumothorax). It can develop suddenly on its own (spontaneous pneumothorax) or from injury (trauma) to the chest. °· Shingles infection (herpes zoster virus). °The chest wall is composed of bones, muscles, and cartilage. Any of these can be the source of the pain. °· The bones can be bruised by injury. °· The muscles or cartilage can be strained by coughing or overwork. °· The cartilage can be affected by inflammation and become sore (costochondritis). °DIAGNOSIS  °Lab tests or other studies, such as X-rays, electrocardiography, stress testing, or cardiac imaging, may be needed to find the cause of your pain.  °TREATMENT  °· Treatment depends on what may be causing your chest pain. Treatment may include: °· Acid blockers for heartburn. °· Anti-inflammatory medicine. °· Pain medicine for inflammatory conditions. °· Antibiotics if an infection is present. °· You may be advised to change lifestyle habits. This includes stopping smoking and avoiding alcohol, caffeine, and chocolate. °· You may be advised to keep your head raised (elevated) when sleeping. This reduces the chance of acid going backward from your stomach into your esophagus. °· Most of the time, nonspecific chest pain will improve within 2 to 3 days with rest and mild pain medicine. °HOME CARE INSTRUCTIONS  °· If antibiotics were prescribed, take your antibiotics as directed. Finish them even if you start to feel better. °· For the next few days, avoid physical  activities that bring on chest pain. Continue physical activities as directed. °· Do not smoke. °· Avoid drinking alcohol. °· Only take over-the-counter or prescription medicine for pain, discomfort, or fever as directed by your caregiver. °· Follow your caregiver's suggestions for further testing if your chest pain does not go away. °· Keep any follow-up appointments you made. If you do not go to an appointment, you could develop lasting (chronic) problems with pain. If there is any problem keeping an appointment, you must call to reschedule. °SEEK MEDICAL CARE IF:  °· You think you are having problems from the medicine you are taking. Read your medicine instructions carefully. °· Your chest pain does not go away, even after treatment. °· You develop a rash with blisters on your chest. °SEEK IMMEDIATE MEDICAL CARE IF:  °· You have increased chest pain or pain that spreads to your arm, neck, jaw, back, or abdomen. °· You develop shortness of breath, an increasing cough, or you are coughing up blood. °· You have severe back or abdominal pain, feel nauseous, or vomit. °· You develop severe weakness, fainting, or chills. °· You have a fever. °THIS IS AN EMERGENCY. Do not wait to see if the pain will go away. Get medical help at once. Call your local emergency services (911 in [place] Do not drive yourself to the hospital. °MAKE SURE YOU:  °· Understand these instructions. °· Will watch your condition. °· Will get help right away if you are not doing well or get worse. °Document Released: 01/12/2005 Document Revised: 06/27/2011 Document Reviewed: 11/08/2007 °Exit[employer]® Patient Information ©2014 [employer]   [employer]. ° °

## 2013-07-31 NOTE — Telephone Encounter (Signed)
Agreed -

## 2013-07-31 NOTE — ED Notes (Signed)
Pt dc to home. Pt sts understanding to dc instructions. Pt ambulatory to exit without difficulty. 
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
## 2013-08-07 ENCOUNTER — Ambulatory Visit (INDEPENDENT_AMBULATORY_CARE_PROVIDER_SITE_OTHER)
Admission: RE | Admit: 2013-08-07 | Discharge: 2013-08-07 | Disposition: A | Discharge status: Home / Self Care | Payer: 59 | Source: Ambulatory Visit | Attending: Family Medicine | Admitting: Family Medicine

## 2013-08-07 ENCOUNTER — Telehealth: Payer: Self-pay | Admitting: Family Medicine

## 2013-08-07 ENCOUNTER — Encounter: Payer: Self-pay | Admitting: *Deleted

## 2013-08-07 ENCOUNTER — Ambulatory Visit (INDEPENDENT_AMBULATORY_CARE_PROVIDER_SITE_OTHER): Payer: 59 | Admitting: Family Medicine

## 2013-08-07 ENCOUNTER — Encounter: Payer: Self-pay | Admitting: Family Medicine

## 2013-08-07 VITALS — BP 115/62 | HR 81 | Temp 98.4°F | Wt 227.5 lb

## 2013-08-07 DIAGNOSIS — R0781 Pleurodynia: Secondary | ICD-10-CM

## 2013-08-07 DIAGNOSIS — S93401A Sprain of unspecified ligament of right ankle, initial encounter: Secondary | ICD-10-CM

## 2013-08-07 DIAGNOSIS — IMO0002 Reserved for concepts with insufficient information to code with codable children: Secondary | ICD-10-CM

## 2013-08-07 DIAGNOSIS — S93409A Sprain of unspecified ligament of unspecified ankle, initial encounter: Secondary | ICD-10-CM

## 2013-08-07 DIAGNOSIS — R079 Chest pain, unspecified: Secondary | ICD-10-CM

## 2013-08-07 DIAGNOSIS — S62646A Nondisplaced fracture of proximal phalanx of right little finger, initial encounter for closed fracture: Secondary | ICD-10-CM
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
## 2013-08-07 NOTE — Progress Notes (Signed)
Pre visit review using our clinic review tool, if applicable. No additional management support is needed unless otherwise documented below in the visit note. 

## 2013-08-07 NOTE — Telephone Encounter (Signed)
Pt dropped off handicap placard application to be completed for while she is in the "boot".  I have placed it on your desk to pass on to Dr. Patsy Lageropland. Thank you.

## 2013-08-07 NOTE — Patient Instructions (Signed)
Work on your ankle motion twice a day.  Wear your CAM boot for 3 more weeks, then wean out of it.

## 2013-08-07 NOTE — Telephone Encounter (Signed)
done

## 2013-08-07 NOTE — Telephone Encounter (Signed)
[facility]atsy [facility] will approve temporary handicap placard for two months.  Form is ready to be picked up at front desk.

## 2013-08-08 ENCOUNTER — Other Ambulatory Visit: Payer: Self-pay

## 2013-08-08 MED ORDER — HYDROCODONE-ACETAMINOPHEN 5-325 MG PO TABS: 1.0000 | ORAL_TABLET | Freq: Four times a day (QID) | ORAL | Status: DC | PRN

## 2013-08-08 NOTE — Telephone Encounter (Signed)
Pt left v/m requesting rx hydrocodone apap. Call when ready for pick up.  

## 2013-08-08 NOTE — Telephone Encounter (Signed)
Left message for patient that prescription is ready to be picked up at front desk.

## 2013-09-04 ENCOUNTER — Ambulatory Visit (INDEPENDENT_AMBULATORY_CARE_PROVIDER_SITE_OTHER): Payer: 59 | Admitting: Family Medicine

## 2013-09-04 ENCOUNTER — Encounter: Payer: Self-pay | Admitting: Family Medicine

## 2013-09-04 VITALS — BP 122/64 | HR 77 | Temp 98.3°F | Ht 65.0 in | Wt 226.2 lb

## 2013-09-04 DIAGNOSIS — S93401A Sprain of unspecified ligament of right ankle, initial encounter: Secondary | ICD-10-CM

## 2013-09-04 DIAGNOSIS — S93409A Sprain of unspecified ligament of unspecified ankle, initial encounter: Secondary | ICD-10-CM
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
## 2013-09-04 NOTE — Patient Instructions (Signed)
Balance Exercises:  While brushing teeth, practice standing on 1 foot. Do for as long as you can, ideally 30 seconds to 1 minute each foot.   

## 2013-09-04 NOTE — Progress Notes (Signed)
Pre visit review using our clinic review tool, if applicable. No additional management support is needed unless otherwise documented below in the visit note. 

## 2013-09-11 ENCOUNTER — Encounter: Payer: Self-pay | Admitting: Family Medicine

## 2013-10-09 ENCOUNTER — Encounter (INDEPENDENT_AMBULATORY_CARE_PROVIDER_SITE_OTHER): Payer: Self-pay

## 2013-10-09 ENCOUNTER — Encounter: Payer: Self-pay | Admitting: Family Medicine

## 2013-10-09 ENCOUNTER — Ambulatory Visit (INDEPENDENT_AMBULATORY_CARE_PROVIDER_SITE_OTHER): Payer: 59 | Admitting: Family Medicine

## 2013-10-09 VITALS — BP 100/60 | HR 85 | Temp 98.1°F | Ht 65.0 in | Wt 224.5 lb

## 2013-10-09 DIAGNOSIS — S93409A Sprain of unspecified ligament of unspecified ankle, initial encounter: Secondary | ICD-10-CM

## 2013-10-09 DIAGNOSIS — S93401D Sprain of unspecified ligament of right ankle, subsequent encounter: Secondary | ICD-10-CM

## 2013-10-09 DIAGNOSIS — Z5189 Encounter for other specified aftercare: Secondary | ICD-10-CM
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
## 2013-10-09 NOTE — Patient Instructions (Signed)
Balance Exercises:  While brushing teeth, practice standing on 1 foot. Do for as long as you can, ideally 30 seconds to 1 minute each foot.   

## 2013-10-09 NOTE — Progress Notes (Signed)
Pre visit review using our clinic review tool, if applicable. No additional management support is needed unless otherwise documented below in the visit note. 

## 2014-03-03 ENCOUNTER — Ambulatory Visit (INDEPENDENT_AMBULATORY_CARE_PROVIDER_SITE_OTHER): Payer: 59 | Admitting: Family Medicine

## 2014-03-03 ENCOUNTER — Encounter: Payer: Self-pay | Admitting: Family Medicine

## 2014-03-03 VITALS — BP 90/56 | HR 66 | Temp 98.1°F | Ht 65.0 in | Wt 222.2 lb

## 2014-03-03 DIAGNOSIS — S93401S Sprain of unspecified ligament of right ankle, sequela: Secondary | ICD-10-CM

## 2014-03-03 NOTE — Progress Notes (Signed)
Pre visit review using our clinic review tool, if applicable. No additional management support is needed unless otherwise documented below in the visit note. 

## 2014-03-03 NOTE — Pre-Procedure Instructions (Signed)
[name]  03/03/2014   Your procedure is scheduled on:  Wed, Nov 25 @ 8:30 AM  [facility]  at 6:30 AM.  Call this number if you have problems the morning of surgery: [phone]   Remember:   Do not eat food or drink liquids after midnight.                Do not take any BC's,Goody's,Aleve,Aspirin,Ibuprofen,Fish Oil,or any Herbal Medications   Do not wear jewelry, make-up or nail polish.  Do not wear lotions, powders, or perfumes. You may wear deodorant.  Do not shave 48 hours prior to surgery.   Do not bring valuables to the hospital.  [facility] is not responsible                  for any belongings or valuables.               Contacts, dentures or bridgework may not be worn into surgery.  Leave suitcase in the car. After surgery it may be brought to your room.  For patients admitted to the hospital, discharge time is determined by your                treatment team.                   Special Instructions:  [facility] - Preparing for Surgery  Before surgery, you can play an important role.  Because skin is not sterile, your skin needs to be as free of germs as possible.  You can reduce the number of germs on you skin by washing with CHG (chlorahexidine gluconate) soap before surgery.  CHG is an antiseptic cleaner which kills germs and bonds with the skin to continue killing germs even after washing.  Please DO NOT use if you have an allergy to CHG or antibacterial soaps.  If your skin becomes reddened/irritated stop using the CHG and inform your nurse when you arrive at Short Stay.  Do not shave (including legs and underarms) for at least 48 hours prior to the first CHG shower.  You may shave your face.  Please follow these instructions carefully:   1.  Shower with CHG Soap the night before surgery and the                                morning of Surgery.  2.  If you choose to wash your hair, wash your hair first as usual with your       normal  shampoo.  3.  After you shampoo, rinse your hair and body thoroughly to remove the                      Shampoo.  4.  Use CHG as you would any other liquid soap.  You can apply chg directly       to the skin and wash gently with scrungie or a clean washcloth.  5.  Apply the CHG Soap to your body ONLY FROM THE NECK DOWN.        Do not use on open wounds or open sores.  Avoid contact with your eyes,       ears, mouth and genitals (private parts).  Wash genitals (private parts)       with your normal soap.  6.  Wash thoroughly, paying special attention to the  area where your surgery        will be performed.  7.  Thoroughly rinse your body with warm water from the neck down.  8.  DO NOT shower/wash with your normal soap after using and rinsing off       the CHG Soap.  9.  Pat yourself dry with a clean towel.            10.  Wear clean pajamas.            11.  Place clean sheets on your bed the night of your first shower and do not        sleep with pets.  Day of Surgery  Do not apply any lotions/deoderants the morning of surgery.  Please wear clean clothes to the hospital/surgery center.     Please read over the following fact sheets that you were given: Pain Booklet, Coughing and Deep Breathing, MRSA Information and Surgical Site Infection Prevention
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
## 2014-03-04 ENCOUNTER — Encounter (HOSPITAL_COMMUNITY)
Admission: RE | Admit: 2014-03-04 | Discharge: 2014-03-04 | Disposition: A | Discharge status: Home / Self Care | Payer: 59 | Source: Ambulatory Visit | Attending: Orthopedic Surgery | Admitting: Orthopedic Surgery

## 2014-03-04 ENCOUNTER — Encounter (HOSPITAL_COMMUNITY): Payer: Self-pay

## 2014-03-04 DIAGNOSIS — Z01812 Encounter for preprocedural laboratory examination: Secondary | ICD-10-CM | POA: Diagnosis present

## 2014-03-04 HISTORY — DX: Personal history of urinary calculi: Z87.442

## 2014-03-04 LAB — URINALYSIS, ROUTINE W REFLEX MICROSCOPIC
BILIRUBIN URINE: NEGATIVE
Glucose, UA: NEGATIVE mg/dL
Hgb urine dipstick: NEGATIVE
Ketones, ur: NEGATIVE mg/dL
NITRITE: NEGATIVE
PH: 6.5 (ref 5.0–8.0)
PROTEIN: NEGATIVE mg/dL
Specific Gravity, Urine: 1.024 (ref 1.005–1.030)
UROBILINOGEN UA: 0.2 mg/dL (ref 0.0–1.0)

## 2014-03-04 LAB — CBC
HEMATOCRIT: 39.7 % (ref 36.0–46.0)
Hemoglobin: 13 g/dL (ref 12.0–15.0)
MCH: 28.3 pg (ref 26.0–34.0)
MCHC: 32.7 g/dL (ref 30.0–36.0)
MCV: 86.3 fL (ref 78.0–100.0)
Platelets: 306 10*3/uL (ref 150–400)
RBC: 4.6 MIL/uL (ref 3.87–5.11)
RDW: 13.8 % (ref 11.5–15.5)
WBC: 6.8 10*3/uL (ref 4.0–10.5)

## 2014-03-04 LAB — APTT: APTT: 34 s (ref 24–37)

## 2014-03-04 LAB — COMPREHENSIVE METABOLIC PANEL
ALT: 16 U/L (ref 0–35)
AST: 17 U/L (ref 0–37)
Albumin: 3.2 g/dL — ABNORMAL LOW (ref 3.5–5.2)
Alkaline Phosphatase: 76 U/L (ref 39–117)
Anion gap: 13 (ref 5–15)
BILIRUBIN TOTAL: 0.2 mg/dL — AB (ref 0.3–1.2)
BUN: 9 mg/dL (ref 6–23)
CO2: 23 mEq/L (ref 19–32)
CREATININE: 0.67 mg/dL (ref 0.50–1.10)
Calcium: 8.9 mg/dL (ref 8.4–10.5)
Chloride: 101 mEq/L (ref 96–112)
GFR calc Af Amer: >90 mL/min (ref >90)
GFR calc non Af Amer: >90 mL/min (ref >90)
Glucose, Bld: 80 mg/dL (ref 70–99)
Potassium: 3.8 mEq/L (ref 3.7–5.3)
Sodium: 137 mEq/L (ref 137–147)
Total Protein: 7 g/dL (ref 6.0–8.3)

## 2014-03-04 LAB — URINE MICROSCOPIC-ADD ON

## 2014-03-04 LAB — PROTIME-INR
INR: 1.01 (ref 0.00–1.49)
Prothrombin Time: 13.4 seconds (ref 11.6–15.2)

## 2014-03-04 LAB — SURGICAL PCR SCREEN
MRSA, PCR: NEGATIVE
Staphylococcus aureus: NEGATIVE

## 2014-03-04 LAB — HCG, SERUM, QUALITATIVE: Preg, Serum: NEGATIVE

## 2014-03-04 NOTE — Progress Notes (Addendum)
  Pt doesn't have a cardiologist  Denies ever having an echo/stress test/heart cath   EKG and CXR in epic from 07-31-13  Medical Md [facility]
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
## 2014-03-11 MED ORDER — VANCOMYCIN HCL 10 G IV SOLR
1500.0000 mg | INTRAVENOUS | Status: AC
  Administered 2014-03-12: 1500 mg via INTRAVENOUS
  Filled 2014-03-11: qty 1500

## 2014-03-11 MED ORDER — DEXAMETHASONE SODIUM PHOSPHATE 4 MG/ML IJ SOLN: 4.0000 mg | INTRAMUSCULAR | Status: AC

## 2014-03-12 ENCOUNTER — Ambulatory Visit (HOSPITAL_COMMUNITY): Payer: 59

## 2014-03-12 ENCOUNTER — Observation Stay (HOSPITAL_COMMUNITY): Payer: 59

## 2014-03-12 ENCOUNTER — Encounter (HOSPITAL_COMMUNITY)
Admission: RE | Disposition: A | Discharge status: Home / Self Care | Payer: Self-pay | Source: Ambulatory Visit | Attending: Orthopedic Surgery

## 2014-03-12 ENCOUNTER — Ambulatory Visit (HOSPITAL_COMMUNITY): Payer: 59 | Admitting: Certified Registered Nurse Anesthetist

## 2014-03-12 ENCOUNTER — Observation Stay (HOSPITAL_COMMUNITY)
Admission: RE | Admit: 2014-03-12 | Discharge: 2014-03-13 | Disposition: A | Discharge status: Home / Self Care | Payer: 59 | Source: Ambulatory Visit | Attending: Orthopedic Surgery | Admitting: Orthopedic Surgery

## 2014-03-12 ENCOUNTER — Encounter (HOSPITAL_COMMUNITY): Payer: Self-pay | Admitting: Certified Registered Nurse Anesthetist

## 2014-03-12 DIAGNOSIS — R079 Chest pain, unspecified: Secondary | ICD-10-CM

## 2014-03-12 DIAGNOSIS — G43909 Migraine, unspecified, not intractable, without status migrainosus: Secondary | ICD-10-CM | POA: Diagnosis not present

## 2014-03-12 DIAGNOSIS — J029 Acute pharyngitis, unspecified: Secondary | ICD-10-CM | POA: Insufficient documentation

## 2014-03-12 DIAGNOSIS — Z87442 Personal history of urinary calculi: Secondary | ICD-10-CM | POA: Insufficient documentation

## 2014-03-12 DIAGNOSIS — M4722 Other spondylosis with radiculopathy, cervical region: Principal | ICD-10-CM | POA: Insufficient documentation

## 2014-03-12 DIAGNOSIS — Z87891 Personal history of nicotine dependence: Secondary | ICD-10-CM | POA: Insufficient documentation

## 2014-03-12 DIAGNOSIS — M5020 Other cervical disc displacement, unspecified cervical region: Secondary | ICD-10-CM

## 2014-03-12 DIAGNOSIS — Z981 Arthrodesis status: Secondary | ICD-10-CM

## 2014-03-12 HISTORY — PX: ANTERIOR CERVICAL DECOMP/DISCECTOMY FUSION: SHX1161

## 2014-03-12 SURGERY — ANTERIOR CERVICAL DECOMPRESSION/DISCECTOMY FUSION 2 LEVELS
Anesthesia: General

## 2014-03-12 MED ORDER — PHENYLEPHRINE HCL 10 MG/ML IJ SOLN
INTRAMUSCULAR | Status: DC | PRN
Start: 2014-03-12 — End: 2014-03-12
  Administered 2014-03-12 (×4): 80 ug via INTRAVENOUS

## 2014-03-12 MED ORDER — HYDROCODONE-ACETAMINOPHEN 5-325 MG PO TABS
1.0000 | ORAL_TABLET | ORAL | Status: DC | PRN
  Administered 2014-03-12 – 2014-03-13 (×4): 1 via ORAL
  Filled 2014-03-12 (×4): qty 1

## 2014-03-12 MED ORDER — THROMBIN 20000 UNITS EX SOLR
CUTANEOUS | Status: DC | PRN
  Administered 2014-03-12: 20 mL via TOPICAL

## 2014-03-12 MED ORDER — METHOCARBAMOL 500 MG PO TABS: 500.0000 mg | ORAL_TABLET | Freq: Four times a day (QID) | ORAL | Status: DC | PRN

## 2014-03-12 MED ORDER — METHOCARBAMOL 500 MG PO TABS
500.0000 mg | ORAL_TABLET | Freq: Four times a day (QID) | ORAL | Status: DC | PRN
  Administered 2014-03-13: 500 mg via ORAL
  Filled 2014-03-12 (×3): qty 1

## 2014-03-12 MED ORDER — GLYCOPYRROLATE 0.2 MG/ML IJ SOLN
INTRAMUSCULAR | Status: DC | PRN
  Administered 2014-03-12: 0.6 mg via INTRAVENOUS

## 2014-03-12 MED ORDER — PROPOFOL 10 MG/ML IV BOLUS
INTRAVENOUS | Status: DC | PRN
  Administered 2014-03-12: 150 mg via INTRAVENOUS

## 2014-03-12 MED ORDER — BUPIVACAINE-EPINEPHRINE (PF) 0.25% -1:200000 IJ SOLN
INTRAMUSCULAR | Status: AC
  Filled 2014-03-12: qty 30

## 2014-03-12 MED ORDER — POLYETHYLENE GLYCOL 3350 17 G PO PACK
17.0000 g | PACK | Freq: Every day | ORAL | Status: DC | PRN
  Filled 2014-03-12: qty 1

## 2014-03-12 MED ORDER — MIDAZOLAM HCL 2 MG/2ML IJ SOLN
INTRAMUSCULAR | Status: AC
  Filled 2014-03-12: qty 2

## 2014-03-12 MED ORDER — MORPHINE SULFATE 2 MG/ML IJ SOLN: 1.0000 mg | INTRAMUSCULAR | Status: DC | PRN

## 2014-03-12 MED ORDER — LIDOCAINE HCL (CARDIAC) 20 MG/ML IV SOLN
INTRAVENOUS | Status: DC | PRN
  Administered 2014-03-12: 50 mg via INTRAVENOUS

## 2014-03-12 MED ORDER — FENTANYL CITRATE 0.05 MG/ML IJ SOLN
25.0000 ug | INTRAMUSCULAR | Status: DC | PRN
  Administered 2014-03-12 (×2): 25 ug via INTRAVENOUS
  Administered 2014-03-12 (×2): 50 ug via INTRAVENOUS

## 2014-03-12 MED ORDER — ONDANSETRON HCL 4 MG/2ML IJ SOLN
INTRAMUSCULAR | Status: DC | PRN
  Administered 2014-03-12: 4 mg via INTRAVENOUS

## 2014-03-12 MED ORDER — DOCUSATE SODIUM 100 MG PO CAPS: 100.0000 mg | ORAL_CAPSULE | Freq: Two times a day (BID) | ORAL | Status: DC

## 2014-03-12 MED ORDER — ARTIFICIAL TEARS OP OINT
TOPICAL_OINTMENT | OPHTHALMIC | Status: DC | PRN
  Administered 2014-03-12: 1 via OPHTHALMIC

## 2014-03-12 MED ORDER — LACTATED RINGERS IV SOLN
INTRAVENOUS | Status: DC | PRN
  Administered 2014-03-12 (×2): via INTRAVENOUS

## 2014-03-12 MED ORDER — ONDANSETRON HCL 4 MG PO TABS: 4.0000 mg | ORAL_TABLET | Freq: Four times a day (QID) | ORAL | Status: DC | PRN

## 2014-03-12 MED ORDER — ROCURONIUM BROMIDE 50 MG/5ML IV SOLN
INTRAVENOUS | Status: AC
  Filled 2014-03-12: qty 1

## 2014-03-12 MED ORDER — MIDAZOLAM HCL 5 MG/5ML IJ SOLN
INTRAMUSCULAR | Status: DC | PRN
  Administered 2014-03-12: 2 mg via INTRAVENOUS

## 2014-03-12 MED ORDER — METOCLOPRAMIDE HCL 5 MG PO TABS
5.0000 mg | ORAL_TABLET | Freq: Three times a day (TID) | ORAL | Status: DC | PRN
  Filled 2014-03-12: qty 2

## 2014-03-12 MED ORDER — VANCOMYCIN HCL IN DEXTROSE 1-5 GM/200ML-% IV SOLN
1000.0000 mg | Freq: Two times a day (BID) | INTRAVENOUS | Status: AC
  Administered 2014-03-12: 1000 mg via INTRAVENOUS
  Filled 2014-03-12: qty 200

## 2014-03-12 MED ORDER — HYDROMORPHONE HCL 1 MG/ML IJ SOLN
1.0000 mg | INTRAMUSCULAR | Status: DC | PRN
  Administered 2014-03-12: 1 mg via INTRAVENOUS

## 2014-03-12 MED ORDER — ONDANSETRON HCL 4 MG/2ML IJ SOLN
INTRAMUSCULAR | Status: AC
  Filled 2014-03-12: qty 2

## 2014-03-12 MED ORDER — FENTANYL CITRATE 0.05 MG/ML IJ SOLN
INTRAMUSCULAR | Status: AC
  Filled 2014-03-12: qty 2

## 2014-03-12 MED ORDER — HYDROMORPHONE HCL 1 MG/ML IJ SOLN
INTRAMUSCULAR | Status: AC
  Administered 2014-03-12: 1 mg via INTRAVENOUS
  Filled 2014-03-12: qty 1

## 2014-03-12 MED ORDER — 0.9 % SODIUM CHLORIDE (POUR BTL) OPTIME
TOPICAL | Status: DC | PRN
  Administered 2014-03-12 (×2): 1000 mL

## 2014-03-12 MED ORDER — PROPOFOL 10 MG/ML IV BOLUS
INTRAVENOUS | Status: AC
  Filled 2014-03-12: qty 20

## 2014-03-12 MED ORDER — FENTANYL CITRATE 0.05 MG/ML IJ SOLN
INTRAMUSCULAR | Status: DC | PRN
  Administered 2014-03-12 (×3): 50 ug via INTRAVENOUS
  Administered 2014-03-12: 100 ug via INTRAVENOUS
  Administered 2014-03-12 (×5): 50 ug via INTRAVENOUS

## 2014-03-12 MED ORDER — SODIUM CHLORIDE 0.9 % IJ SOLN
INTRAMUSCULAR | Status: AC
  Filled 2014-03-12: qty 10

## 2014-03-12 MED ORDER — FENTANYL CITRATE 0.05 MG/ML IJ SOLN
INTRAMUSCULAR | Status: AC
  Filled 2014-03-12: qty 5

## 2014-03-12 MED ORDER — POLYETHYLENE GLYCOL 3350 17 G PO PACK: 17.0000 g | PACK | Freq: Every day | ORAL | Status: DC

## 2014-03-12 MED ORDER — METOCLOPRAMIDE HCL 5 MG/ML IJ SOLN
5.0000 mg | Freq: Three times a day (TID) | INTRAMUSCULAR | Status: DC | PRN
  Filled 2014-03-12: qty 2

## 2014-03-12 MED ORDER — ONDANSETRON HCL 4 MG/2ML IJ SOLN: 4.0000 mg | Freq: Four times a day (QID) | INTRAMUSCULAR | Status: DC | PRN

## 2014-03-12 MED ORDER — DOCUSATE SODIUM 100 MG PO CAPS
100.0000 mg | ORAL_CAPSULE | Freq: Two times a day (BID) | ORAL | Status: DC
  Filled 2014-03-12 (×3): qty 1

## 2014-03-12 MED ORDER — METHOCARBAMOL 1000 MG/10ML IJ SOLN
500.0000 mg | Freq: Four times a day (QID) | INTRAVENOUS | Status: DC | PRN
  Administered 2014-03-12: 500 mg via INTRAVENOUS
  Filled 2014-03-12 (×2): qty 5

## 2014-03-12 MED ORDER — NEOSTIGMINE METHYLSULFATE 10 MG/10ML IV SOLN
INTRAVENOUS | Status: DC | PRN
  Administered 2014-03-12: 4 mg via INTRAVENOUS

## 2014-03-12 MED ORDER — LIDOCAINE HCL (CARDIAC) 20 MG/ML IV SOLN
INTRAVENOUS | Status: AC
  Filled 2014-03-12: qty 5

## 2014-03-12 MED ORDER — THROMBIN 20000 UNITS EX SOLR
CUTANEOUS | Status: AC
  Filled 2014-03-12: qty 20000

## 2014-03-12 MED ORDER — ONDANSETRON 4 MG PO TBDP: 4.0000 mg | ORAL_TABLET | Freq: Three times a day (TID) | ORAL | Status: DC | PRN

## 2014-03-12 MED ORDER — HYDROCODONE-ACETAMINOPHEN 10-325 MG PO TABS: 1.0000 | ORAL_TABLET | Freq: Four times a day (QID) | ORAL | Status: DC | PRN

## 2014-03-12 MED ORDER — METOPROLOL TARTRATE 1 MG/ML IV SOLN
INTRAVENOUS | Status: DC | PRN
  Administered 2014-03-12: 1 mg via INTRAVENOUS

## 2014-03-12 MED ORDER — ROCURONIUM BROMIDE 100 MG/10ML IV SOLN
INTRAVENOUS | Status: DC | PRN
  Administered 2014-03-12: 10 mg via INTRAVENOUS
  Administered 2014-03-12 (×2): 20 mg via INTRAVENOUS
  Administered 2014-03-12: 50 mg via INTRAVENOUS

## 2014-03-12 MED ORDER — EPHEDRINE SULFATE 50 MG/ML IJ SOLN
INTRAMUSCULAR | Status: AC
  Filled 2014-03-12: qty 1

## 2014-03-12 MED ORDER — HYDROMORPHONE HCL 1 MG/ML PO LIQD
1.0000 mg | ORAL | Status: DC | PRN
Start: 2014-03-12 — End: 2014-03-12

## 2014-03-12 MED ORDER — POTASSIUM CHLORIDE IN NACL 20-0.45 MEQ/L-% IV SOLN
INTRAVENOUS | Status: DC
  Filled 2014-03-12 (×3): qty 1000

## 2014-03-12 MED ORDER — BUPIVACAINE-EPINEPHRINE 0.25% -1:200000 IJ SOLN
INTRAMUSCULAR | Status: DC | PRN
  Administered 2014-03-12: 4 mL

## 2014-03-12 SURGICAL SUPPLY — 72 items
BIT DRILL SRG 14X2.2XFLT CHK (BIT) ×1 IMPLANT
BIT DRL SRG 14X2.2XFLT CHK (BIT) ×1
BLADE SURG ROTATE 9660 (MISCELLANEOUS) IMPLANT
BUR EGG ELITE 4.0 (BURR) ×2 IMPLANT
BUR EGG ELITE 4.0MM (BURR) ×1
BUR MATCHSTICK NEURO 3.0 LAGG (BURR) ×3 IMPLANT
CANISTER SUCTION 2500CC (MISCELLANEOUS) ×3 IMPLANT
CLOSURE STERI-STRIP 1/2X4 (GAUZE/BANDAGES/DRESSINGS) ×1
CLSR STERI-STRIP ANTIMIC 1/2X4 (GAUZE/BANDAGES/DRESSINGS) ×2 IMPLANT
CORDS BIPOLAR (ELECTRODE) ×3 IMPLANT
COVER SURGICAL LIGHT HANDLE (MISCELLANEOUS) ×3 IMPLANT
CRADLE DONUT ADULT HEAD (MISCELLANEOUS) ×3 IMPLANT
DEVICE ENDSKLTN LG TC 6VBR 7MM (Orthopedic Implant) ×1 IMPLANT
DRAPE C-ARM 42X72 X-RAY (DRAPES) ×3 IMPLANT
DRAPE ORTHO SPLIT 77X108 STRL (DRAPES) ×2
DRAPE POUCH INSTRU U-SHP 10X18 (DRAPES) ×3 IMPLANT
DRAPE SURG 17X23 STRL (DRAPES) ×3 IMPLANT
DRAPE SURG ORHT 6 SPLT 77X108 (DRAPES) ×1 IMPLANT
DRAPE U-SHAPE 47X51 STRL (DRAPES) IMPLANT
DRILL BIT SKYLINE 14MM (BIT) ×2
DRSG MEPILEX BORDER 4X4 (GAUZE/BANDAGES/DRESSINGS) ×3 IMPLANT
DURAPREP 26ML APPLICATOR (WOUND CARE) ×3 IMPLANT
ELECT COATED BLADE 2.86 ST (ELECTRODE) ×3 IMPLANT
ELECT PENCIL ROCKER SW 15FT (MISCELLANEOUS) ×3 IMPLANT
ELECT REM PT RETURN 9FT ADLT (ELECTROSURGICAL) ×3
ELECTRODE REM PT RTRN 9FT ADLT (ELECTROSURGICAL) ×1 IMPLANT
ENDOSKELETON LG TC 6VBR 7MM (Orthopedic Implant) ×3 IMPLANT
ENDOSKELETON LG TC 6VBR 8MM (Orthopedic Implant) ×6 IMPLANT
GLOVE BIOGEL PI IND STRL 6.5 (GLOVE) ×2 IMPLANT
GLOVE BIOGEL PI IND STRL 8 (GLOVE) ×1 IMPLANT
GLOVE BIOGEL PI IND STRL 8.5 (GLOVE) ×1 IMPLANT
GLOVE BIOGEL PI INDICATOR 6.5 (GLOVE) ×4
GLOVE BIOGEL PI INDICATOR 8 (GLOVE) ×2
GLOVE BIOGEL PI INDICATOR 8.5 (GLOVE) ×2
GLOVE ECLIPSE 8.5 STRL (GLOVE) ×3 IMPLANT
GLOVE ORTHO TXT STRL SZ7.5 (GLOVE) ×3 IMPLANT
GOWN STRL REUS W/ TWL LRG LVL3 (GOWN DISPOSABLE) ×1 IMPLANT
GOWN STRL REUS W/TWL 2XL LVL3 (GOWN DISPOSABLE) ×6 IMPLANT
GOWN STRL REUS W/TWL LRG LVL3 (GOWN DISPOSABLE) ×2
KIT BASIN OR (CUSTOM PROCEDURE TRAY) ×3 IMPLANT
KIT ROOM TURNOVER OR (KITS) ×3 IMPLANT
NEEDLE SPNL 18GX3.5 QUINCKE PK (NEEDLE) ×3 IMPLANT
NS IRRIG 1000ML POUR BTL (IV SOLUTION) ×3 IMPLANT
PACK ORTHO CERVICAL (CUSTOM PROCEDURE TRAY) ×3 IMPLANT
PACK UNIVERSAL I (CUSTOM PROCEDURE TRAY) ×3 IMPLANT
PAD ARMBOARD 7.5X6 YLW CONV (MISCELLANEOUS) ×6 IMPLANT
PATTIES SURGICAL .25X.25 (GAUZE/BANDAGES/DRESSINGS) ×3 IMPLANT
PIN DISTRACTION 14 (PIN) ×3 IMPLANT
PIN RETAINER PRODISC 14 MM (PIN) ×3 IMPLANT
PLATE SKYLINE TWO LEVEL 32MM (Plate) ×3 IMPLANT
PUTTY BONE DBX 5CC MIX (Putty) ×3 IMPLANT
RESTRAINT LIMB HOLDER UNIV (RESTRAINTS) ×3 IMPLANT
SCREW SKYLINE 14MM SD-VA (Screw) ×12 IMPLANT
SCREW SKYLINE 16MM (Screw) ×6 IMPLANT
SCREW SKYLINE VAR OS 14MM (Screw) ×3 IMPLANT
SPONGE INTESTINAL PEANUT (DISPOSABLE) ×12 IMPLANT
SPONGE LAP 4X18 X RAY DECT (DISPOSABLE) ×6 IMPLANT
SPONGE SURGIFOAM ABS GEL 100 (HEMOSTASIS) ×3 IMPLANT
SURGIFLO TRUKIT (HEMOSTASIS) ×3 IMPLANT
SUT BONE WAX W31G (SUTURE) ×3 IMPLANT
SUT MON AB 3-0 SH 27 (SUTURE) ×2
SUT MON AB 3-0 SH27 (SUTURE) ×1 IMPLANT
SUT SILK 2 0 (SUTURE)
SUT SILK 2-0 18XBRD TIE 12 (SUTURE) IMPLANT
SUT VIC AB 2-0 CT1 18 (SUTURE) ×3 IMPLANT
SYR BULB IRRIGATION 50ML (SYRINGE) ×6 IMPLANT
SYR CONTROL 10ML LL (SYRINGE) ×3 IMPLANT
TAPE CLOTH 4X10 WHT NS (GAUZE/BANDAGES/DRESSINGS) ×3 IMPLANT
TAPE UMBILICAL COTTON 1/8X30 (MISCELLANEOUS) ×3 IMPLANT
TOWEL OR 17X24 6PK STRL BLUE (TOWEL DISPOSABLE) ×3 IMPLANT
TOWEL OR 17X26 10 PK STRL BLUE (TOWEL DISPOSABLE) ×3 IMPLANT
WATER STERILE IRR 1000ML POUR (IV SOLUTION) IMPLANT

## 2014-03-12 NOTE — Brief Op Note (Signed)
03/12/2014  5:55 PM  PATIENT:  Erica SnideShonda T Barrera  33 y.o. female  PRE-OPERATIVE DIAGNOSIS:  C5-7 HP  POST-OPERATIVE DIAGNOSIS:  C5-7 HP  PROCEDURE:  Procedure(s): ACDF C5-7 (N/A)  SURGEON:  Surgeon(s) and Role:    * Venita Lickahari Siniyah Evangelist, MD - Primary  PHYSICIAN ASSISTANT:   ASSISTANTS: Zonia Kiefjames  owens   ANESTHESIA:   general  EBL:  Total I/O In: 1000 [I.V.:1000] Out: 50 [Blood:50]  BLOOD ADMINISTERED:none  DRAINS: none   LOCAL MEDICATIONS USED:  MARCAINE     SPECIMEN:  No Specimen  DISPOSITION OF SPECIMEN:  N/A  COUNTS:  YES  TOURNIQUET:  * No tourniquets in log *  DICTATION: .Other Dictation: Dictation Number [id number]  PLAN OF CARE: Admit for overnight observation  PATIENT DISPOSITION:  PACU - hemodynamically stable.

## 2014-03-12 NOTE — Transfer of Care (Signed)
Immediate Anesthesia Transfer of Care Note  Patient: Erica SnideShonda T Viscomi  Procedure(s) Performed: Procedure(s): ACDF C5-7 (N/A)  Patient Location: PACU  Anesthesia Type:General  Level of Consciousness: awake, alert  and oriented  Airway & Oxygen Therapy: Patient Spontanous Breathing and Patient connected to nasal cannula oxygen  Post-op Assessment: Report given to PACU RN and Post -op Vital signs reviewed and stable  Post vital signs: Reviewed and stable  Complications: No apparent anesthesia complications

## 2014-03-12 NOTE — Anesthesia Postprocedure Evaluation (Signed)
  Anesthesia Post-op Note  Patient: Ladonna SnideShonda T Govea  Procedure(s) Performed: Procedure(s): ACDF C5-7 (N/A)  Patient Location: PACU  Anesthesia Type:General  Level of Consciousness: awake  Airway and Oxygen Therapy: Patient Spontanous Breathing  Post-op Pain: mild  Post-op Assessment: Post-op Vital signs reviewed  Post-op Vital Signs: Reviewed  Last Vitals:  Filed Vitals:   03/12/14 1535  BP:   Pulse: 89  Temp: 36.4 C  Resp:     Complications: No apparent anesthesia complications

## 2014-03-12 NOTE — Progress Notes (Signed)
Pharmacist discontinued morphine that was ordered for patient. RN called pharmacist to clarify why order was discontinued. Pharmacist stated it was d/t patient's allergy reaction to codeine. RN notified MD for new order.  Pharmacist suggested MD to order a different medication and order received and carried out. Marin RobertsAisha Cindel Daugherty RN
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
## 2014-03-12 NOTE — Plan of Care (Signed)
Problem: Consults Goal: Diagnosis - Spinal Surgery Outcome: Completed/Met Date Met:  03/12/14 Cervical Spine Fusion

## 2014-03-12 NOTE — Discharge Instructions (Signed)

## 2014-03-12 NOTE — Plan of Care (Signed)
Problem: Consults Goal: Spinal Surgery Patient Education See Patient Education Module for education specifics. Outcome: Completed/Met Date Met:  03/12/14     

## 2014-03-12 NOTE — Progress Notes (Signed)
Patient c/o chest pain/pressure, worse with coughing with or without the aspen collar. She also stated she has shortness of breath. 2L oxygen via [place] was applied with sats of 100% with no relieve. MD was called for orders. Vitals: 98.1,100, 118/59,18, 100% on 2L. Rapid response nurse called to help reassess patient. Medication given and patient resting comfortably. Rn will continue to monitor patient status and give report to incoming RN. Marin RobertsAisha Xerxes Agrusa RN

## 2014-03-12 NOTE — Significant Event (Addendum)
Rapid Response Event Note  Overview: Time Called: 1803 Arrival Time: 1808 Event Type: Other (Comment) (Chest pain)  Initial Focused Assessment: Patient status post ACDF BP 118/59  SR 95  RR 18  O2 sat 100% on 2l [place] Patient complaining of sub sternal chest pain/pressure, worse with deep breath and cough.  Denies burning. She also states she is SOB Lung sounds clear, no wheezes or stridor. Heart tones regular  Interventions: 12 lead EKG done - SR Awaiting PCXR 1mg  dilaudid given IV Patient states that she "hurts everywhere". RN to reassess pain RN to call if assistance needed  Event Summary: Name of Physician Notified: Brooks at 1800    at    Outcome: Stayed in room and stabalized  Event End Time: 1901  Marcellina MillinLayton, Lennin Osmond

## 2014-03-13 DIAGNOSIS — M4722 Other spondylosis with radiculopathy, cervical region: Secondary | ICD-10-CM | POA: Diagnosis not present

## 2014-03-13 NOTE — Evaluation (Signed)
Physical Therapy Evaluation Patient Details Name: Regan RakersShonda T Accardi MRN: [id number] DOB: 1981/04/09 Today's Date: 03/13/2014   History of Present Illness  33 y.o. female s/p Anterior cervical diskectomy and fusion, C5-6 and C6-7  Clinical Impression  Patient evaluated by Physical Therapy with no further acute PT needs identified. All education has been completed and the patient has no further questions. Ambulates slowly but with good balance up to 230 feet and safely completed stair training. Precautions reviewed and pt/family has no further concerns at this time. See below for any follow-up Physial Therapy or equipment needs. PT is signing off. Thank you for this referral.     Follow Up Recommendations No PT follow up    Equipment Recommendations  None recommended by PT    Recommendations for Other Services       Precautions / Restrictions Precautions Precautions: Cervical Precaution Comments: Handout provided and reviewed for cervical precautions Required Braces or Orthoses: Cervical Brace Cervical Brace: Hard collar;At all times Restrictions Weight Bearing Restrictions: No      Mobility  Bed Mobility Overal bed mobility: Modified Independent                Transfers Overall transfer level: Modified independent Equipment used: None                Ambulation/Gait Ambulation/Gait assistance: Supervision Ambulation Distance (Feet): 230 Feet Assistive device: None Gait Pattern/deviations: Step-through pattern;Decreased stride length   Gait velocity interpretation: Below normal speed for age/gender General Gait Details: Slow and guarded, small steps. VC to increase stride length with good response. Pt with no loss of balance during bout.  Stairs Stairs: Yes Stairs assistance: Supervision Stair Management: One rail Right;One rail Left;Alternating pattern;Step to pattern;Forwards Number of Stairs: 13 (4 steps with rail on Rt) General stair comments:  Educated on safe stair navigation with cues for sequencing. Pt able to perform without loss of balance, states she feels confident with this task.  Wheelchair Mobility    Modified Rankin (Stroke Patients Only)       Balance Overall balance assessment: Needs assistance Sitting-balance support: No upper extremity supported;Feet supported Sitting balance-Leahy Scale: Good     Standing balance support: No upper extremity supported Standing balance-Leahy Scale: Fair                               Pertinent Vitals/Pain Pain Assessment: 0-10 Pain Score:  (Upper back a "little" sore. no value given) Pain Location: "upper back" Pain Descriptors / Indicators: Sore Pain Intervention(s): Monitored during session;Repositioned    Home Living Family/patient expects to be discharged to:: Private residence Living Arrangements: Parent;Children Available Help at Discharge: Family;Available 24 hours/day Type of Home: House Home Access: Stairs to enter Entrance Stairs-Rails: Right Entrance Stairs-Number of Steps: 4 Home Layout: Two level Home Equipment: None      Prior Function Level of Independence: Independent               Hand Dominance        Extremity/Trunk Assessment   Upper Extremity Assessment: Defer to OT evaluation           Lower Extremity Assessment: Overall WFL for tasks assessed         Communication   Communication: No difficulties  Cognition Arousal/Alertness: Awake/alert Behavior During Therapy: WFL for tasks assessed/performed Overall Cognitive Status: Within Functional Limits for tasks assessed  General Comments      Exercises        Assessment/Plan    PT Assessment Patent does not need any further PT services  PT Diagnosis Acute pain;Abnormality of gait   PT Problem List    PT Treatment Interventions     PT Goals (Current goals can be found in the Care Plan section) Acute Rehab PT  Goals Patient Stated Goal: Go home PT Goal Formulation: All assessment and education complete, DC therapy    Frequency     Barriers to discharge        Co-evaluation               End of Session Equipment Utilized During Treatment: Gait belt;Cervical collar Activity Tolerance: Patient tolerated treatment well Patient left: in bed;with call bell/phone within reach;with family/visitor present (EOB) Nurse Communication: Mobility status    Functional Assessment Tool Used: clinical observation Functional Limitation: Mobility: Walking and moving around Mobility: Walking and Moving Around Current Status ([id number]): At least 1 percent but less than 20 percent impaired, limited or restricted Mobility: Walking and Moving Around Goal Status [phone](G8979): At least 1 percent but less than 20 percent impaired, limited or restricted Mobility: Walking and Moving Around Discharge Status [phone](G8980): At least 1 percent but less than 20 percent impaired, limited or restricted    Time: 0811-0825 PT Time Calculation (min) (ACUTE ONLY): 14 min   Charges:   PT Evaluation $Initial PT Evaluation Tier I: 1 Procedure     PT G Codes:   Functional Assessment Tool Used: clinical observation Functional Limitation: Mobility: Walking and moving around    Berton MountBarbour, Jennafer Gladue S 03/13/2014, 9:34 AM  Charlsie MerlesLogan Secor Mele Sylvester, PT [phone]

## 2014-03-13 NOTE — Progress Notes (Signed)
   Subjective: 1 Day Post-Op Procedure(s) (LRB): ACDF C5-7 (N/A) Patient reports pain as mild and moderate.   Patient seen in rounds with Dr. Lequita HaltAluisio.  Family in room at bedside. Complaints of sore throat and coughing of some phlegm. Patient is well, but has had some minor complaints of pain in the neck, requiring pain medications Patient is ready to go home today with family.  She had been walking the hallway and voiding okay  Objective: Vital signs in last 24 hours: Temp:  [97.6 F (36.4 C)-98.8 F (37.1 C)] 98.3 F (36.8 C) (11/26 0439) Pulse Rate:  [72-106] 106 (11/26 0439) Resp:  [5-33] 17 (11/26 0439) BP: (107-123)/(49-73) 111/49 mmHg (11/26 0439) SpO2:  [96 %-100 %] 100 % (11/26 0439)  Intake/Output from previous day:  Intake/Output Summary (Last 24 hours) at 03/13/14 0849 Last data filed at 03/12/14 1900  Gross per 24 hour  Intake   1240 ml  Output     50 ml  Net   1190 ml    Intake/Output this shift:    Labs: No results for input(s): HGB in the last 72 hours. No results for input(s): WBC, RBC, HCT, PLT in the last 72 hours. No results for input(s): NA, K, CL, CO2, BUN, CREATININE, GLUCOSE, CALCIUM in the last 72 hours. No results for input(s): LABPT, INR in the last 72 hours.  EXAM: General - Patient is Alert, Appropriate and Oriented Extremity - Neurovascular intact Sensation intact distally good strenght to both upper extremities Dressing - clean, dry   Assessment/Plan: 1 Day Post-Op Procedure(s) (LRB): ACDF C5-7 (N/A) Procedure(s) (LRB): ACDF C5-7 (N/A) Past Medical History  Diagnosis Date  . History of bronchitis 2009  . History of migraine     last one on 03/02/14  . Weakness     tingling and buring in right hand/arm  . Chronic neck pain     DDD/stenosis  . History of kidney stones   . Panic attacks     but doesn't require meds   Active Problems:   S/P cervical spinal fusion  Estimated body mass index is 37.11 kg/(m^2) as calculated  from the following:   Height as of this encounter: 5\' 5"  (1.651 m).   Weight as of this encounter: 101.152 kg (223 lb). Discharge home Diet - Regular diet Follow up - in 2 weeks Activity - WBAT Disposition - Home Condition Upon Discharge - Good D/C Meds - See DC Summary  Avel Peacerew Perkins, PA-C Orthopaedic Surgery 03/13/2014, 8:49 AM

## 2014-03-13 NOTE — Progress Notes (Signed)
UR completed 

## 2014-03-13 NOTE — Progress Notes (Signed)
Pt doing well. Pt and mother given D/C instructions with Rx's, verbal understanding was provided. Pt's incision is clean and dry with no sign of infection. Pt's IV was removed prior to D/C. Pt D/C'd home with Aspen collar @ 0915 per MD order. Pt is stable @ D/C and has no other needs at this time. Rema FendtAshley Shaheer Bonfield, RN

## 2014-03-13 NOTE — Op Note (Signed)
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  POSTOPERATIVE DIAGNOSIS:  Cervical spondylotic radiculopathy, C5-6 and C6-7.  OPERATIVE PROCEDURE:  Anterior cervical diskectomy and fusion, C5-6 and C6-7.  SURGEON:  Alvy Bealahari D Chanin Frumkin, MD  FIRST ASSISTANT:  Zonia KiefJames Owens, PA.  HISTORY:  This is a very pleasant woman who has been complaining of severe neck and radicular right arm pain.  Despite appropriate conservative management, she continued to have severe debilitating pain. As a result of the failure of conservative management, we elected to proceed with surgery.  All appropriate risks, benefits, and alternatives to surgery were discussed and consent was obtained.  OPERATIVE NOTE:  The patient was brought to the operating room, placed supine on the operating table.  After successful induction of general anesthesia and endotracheal intubation, TEDs and SCDs were applied. Inflatable cuff was placed underneath the shoulder blades.  The arms were secured at the side and the anterior cervical spine was prepped and draped in a standard fashion.  Time-out was taken to confirm patient, procedure, and all other pertinent important data.  Once this was done, x-ray was used to identify the appropriate starting position, and a midline incision was made.  This started at the midline and proceeded to the left.  Sharp dissection was carried out down deep to the platysma. The platysma was sharply incised and then I sharply dissected through the deep cervical fascia along the medial border of the sternocleidomastoid.  I then palpated and visualized the  carotid sheath, protected it with a finger, and then swept the esophagus to the right. Retractors were placed, and then I could identify the anterior longitudinal ligament.  I removed the remaining prevertebral fascia.  I then placed a needle into the C5-6 disk space.  Intraoperative fluoro in the lateral plane confirmed that I was at the appropriate level.  I then mobilized the longus colli muscles from the midbody of C5 to the midbody of C7.  Self-retaining retractors were placed underneath the longus colli muscle.  The endotracheal cuff was deflated, and I expanded the retractor and reinflated the cuff.  Distraction pins were placed into the bodies of 6 and 7, and an annulotomy was performed with a 15-blade scalpel.  Pituitary rongeurs were used to remove the bulk of the disk material.  I then used a curette to remove the posterior portion of the disk material.  I then identified a rent in the annulus and used a nerve hook and a micropituitary rongeur to deliver an extra soft disk herniation at the right-hand side.  I then used the nerve hook and exploited the rent in the annulus and then used an 1 mm Kerrison to resect the posterior annulus and posterior longitudinal ligament.  Two other small fragments of disk material were found and removed.  At this point, I  had an adequate decompression, I can easily palpate behind the body of C6 and C7, I took x-rays to confirm this.  I then confirmed that I had removed the disk herniation that was consistent with what was seen on her preoperative MRI.  I rasped the endplates and then trialed and then initially placed an 8 Titan titanium cage and then swiped this out for a 7 to provide a better fit.  The 7 large Titan titanium cage was packed with DBX mix prior to malleting it to the final resting position. I then repositioned the distraction pin into the C5 and the distractors and then performed the same diskectomy at 5-6.  This was done with  the same technique.  At this time, I used a size 8.  Again the posterior longitudinal ligament was removed, and I did find fragments of disk material in the right lateral gutter.  The endplates were rasped and size 8 Titan titanium cage was placed.  With both cages properly positioned, I then contoured a 32-mm anterior cervical DePuy Skyline plate and secured it with 16 mm screws into the body of C5 and 14 mm screws into the bodies of 6 and 7.  All screws had excellent purchase. I then locked the screws to the plate according to the manufacturers standard.  I then removed all the retractors, irrigated copiously with normal saline, and made sure I had hemostasis using bipolar electrocautery.  I then returned the trachea and esophagus to midline after ensuring that the esophagus was not entrapped beneath the plate. I then closed the platysma with interrupted 2-0 Vicryl sutures and a 3-0 Monocryl.  Steri-Strips and dry dressing were applied.  The patient was extubated, transferred to the PACU without incident.  At the end of the case, all needle and sponge counts were correct.  First assistant was Zonia KiefJames Owens, my PA, he was instrumental in assisting with wound closure, retraction, and visualization.     Alvy Bealahari D Berry Gallacher, MD     DDB/MEDQ  D:  03/12/2014  T:  03/13/2014  Job:  [id number]  cc:   Genene ChurnJames M. Barry Dieneswens, P.A.

## 2014-03-17 ENCOUNTER — Encounter (HOSPITAL_COMMUNITY): Payer: Self-pay | Admitting: Orthopedic Surgery

## 2014-04-08 ENCOUNTER — Ambulatory Visit (INDEPENDENT_AMBULATORY_CARE_PROVIDER_SITE_OTHER): Payer: 59 | Admitting: Internal Medicine

## 2014-04-08 ENCOUNTER — Encounter: Payer: Self-pay | Admitting: Internal Medicine

## 2014-04-08 VITALS — BP 104/60 | HR 88 | Temp 98.4°F | Wt 218.0 lb

## 2014-04-08 DIAGNOSIS — J069 Acute upper respiratory infection, unspecified: Secondary | ICD-10-CM

## 2014-04-08 MED ORDER — AZITHROMYCIN 250 MG PO TABS: ORAL_TABLET | ORAL | Status: DC

## 2014-04-08 MED ORDER — HYDROCODONE-HOMATROPINE 5-1.5 MG/5ML PO SYRP: 5.0000 mL | ORAL_SOLUTION | Freq: Three times a day (TID) | ORAL | Status: DC | PRN

## 2014-04-08 NOTE — Progress Notes (Signed)
Pre visit review using our clinic review tool, if applicable. No additional management support is needed unless otherwise documented below in the visit note. 

## 2014-04-08 NOTE — Progress Notes (Signed)
HPI  Pt presents to the clinic today with c/o nasal congestion, cough and body aches. She reports this started 3 days ago. The cough is productive of thick yellow mucous. She is also blowing yellow mucous out of her nose. She reports running low grade fevers and chills. She has tried taking Dayquil and Sudafed without much relief. She has a history of allergies but denies breathing problems. She has had sick contacts.  Review of Systems      Past Medical History  Diagnosis Date  . History of bronchitis 2009  . History of migraine     last one on 03/02/14  . Weakness     tingling and buring in right hand/arm  . Chronic neck pain     DDD/stenosis  . History of kidney stones   . Panic attacks     but doesn't require meds    Family History  Problem Relation Age of Onset  . Hypertension Mother   . Graves' disease Brother     History   Social History  . Marital Status: Single    Spouse Name: N/A    Number of Children: 1  . Years of Education: N/A   Occupational History  . [employer] sales employee    Social History Main Topics  . Smoking status: Former Games developermoker  . Smokeless tobacco: Never Used     Comment: quit smoking 6656yrs ago  . Alcohol Use: 0.5 oz/week    1 Not specified per week     Comment: socially  . Drug Use: No  . Sexual Activity: No   Other Topics Concern  . Not on file   Social History Narrative   Single   Regular exercise  2 days a week   Healthy eating.          Allergies  Allergen Reactions  . Percocet [Oxycodone-Acetaminophen] Anaphylaxis and Hives  . Topamax [Topiramate]     Dry mouth,chills,hot flashes,nauseated  . Penicillins Rash    REACTION: rash     Constitutional: Positive headache, fatigue and fever. Denies abrupt weight changes.  HEENT:  Positive nasal congestion, sore throat. Denies eye redness, eye pain, pressure behind the eyes, facial pain, ear pain, ringing in the ears, wax buildup, runny nose or bloody nose. Respiratory:  Positive cough. Denies difficulty breathing or shortness of breath.  Cardiovascular: Denies chest pain, chest tightness, palpitations or swelling in the hands or feet.   No other specific complaints in a complete review of systems (except as listed in HPI above).  Objective:   BP 104/60 mmHg  Pulse 88  Temp(Src) 98.4 F (36.9 C) (Oral)  Wt 218 lb (98.884 kg)  SpO2 98% Wt Readings from Last 3 Encounters:  04/08/14 218 lb (98.884 kg)  03/12/14 223 lb (101.152 kg)  03/04/14 223 lb 4.8 oz (101.288 kg)     General: Appears her stated age, ill appearing in NAD. HEENT: Head: normal shape and size, no sinus tenderness noted; Eyes: sclera white, no icterus, conjunctiva pinkt; Ears: Tm's pink but intact, normal light reflex; Nose: mucosa pink and moist, septum midline; Throat/Mouth: + PND. Teeth present, mucosa erythematous and moist, no exudate noted, no lesions or ulcerations noted.  Neck: No cervical lymphadenopathy.   Cardiovascular: Normal rate and rhythm. S1,S2 noted.  No murmur, rubs or gallops noted.  Pulmonary/Chest: Normal effort and positive vesicular breath sounds. No respiratory distress. No wheezes, rales or ronchi noted.      Assessment & Plan:   Upper Respiratory Infection:  Likely  viral at this point Get some rest and drink plenty of water Do salt water gargle/ibuprofen for the sore throat Get Flonase OTC for the nasal congestion Rx for Hycodan cough syrup  Will print RX  Azithromax x 5 days to start by Friday if not improving  RTC as needed or if symptoms persist.

## 2014-04-08 NOTE — Patient Instructions (Signed)
Upper Respiratory Infection, Adult An upper respiratory infection (URI) is also sometimes known as the common cold. The upper respiratory tract includes the nose, sinuses, throat, trachea, and bronchi. Bronchi are the airways leading to the lungs. Most people improve within 1 week, but symptoms can last up to 2 weeks. A residual cough may last even longer.  CAUSES Many different viruses can infect the tissues lining the upper respiratory tract. The tissues become irritated and inflamed and often become very moist. Mucus production is also common. A cold is contagious. You can easily spread the virus to others by oral contact. This includes kissing, sharing a glass, coughing, or sneezing. Touching your mouth or nose and then touching a surface, which is then touched by another person, can also spread the virus. SYMPTOMS  Symptoms typically develop 1 to 3 days after you come in contact with a cold virus. Symptoms vary from person to person. They may include:  Runny nose.  Sneezing.  Nasal congestion.  Sinus irritation.  Sore throat.  Loss of voice (laryngitis).  Cough.  Fatigue.  Muscle aches.  Loss of appetite.  Headache.  Low-grade fever. DIAGNOSIS  You might diagnose your own cold based on familiar symptoms, since most people get a cold 2 to 3 times a year. Your caregiver can confirm this based on your exam. Most importantly, your caregiver can check that your symptoms are not due to another disease such as strep throat, sinusitis, pneumonia, asthma, or epiglottitis. Blood tests, throat tests, and X-rays are not necessary to diagnose a common cold, but they may sometimes be helpful in excluding other more serious diseases. Your caregiver will decide if any further tests are required. RISKS AND COMPLICATIONS  You may be at risk for a more severe case of the common cold if you smoke cigarettes, have chronic heart disease (such as heart failure) or lung disease (such as asthma), or if  you have a weakened immune system. The very young and very old are also at risk for more serious infections. Bacterial sinusitis, middle ear infections, and bacterial pneumonia can complicate the common cold. The common cold can worsen asthma and chronic obstructive pulmonary disease (COPD). Sometimes, these complications can require emergency medical care and may be life-threatening. PREVENTION  The best way to protect against getting a cold is to practice good hygiene. Avoid oral or hand contact with people with cold symptoms. Wash your hands often if contact occurs. There is no clear evidence that vitamin C, vitamin E, echinacea, or exercise reduces the chance of developing a cold. However, it is always recommended to get plenty of rest and practice good nutrition. TREATMENT  Treatment is directed at relieving symptoms. There is no cure. Antibiotics are not effective, because the infection is caused by a virus, not by bacteria. Treatment may include:  Increased fluid intake. Sports drinks offer valuable electrolytes, sugars, and fluids.  Breathing heated mist or steam (vaporizer or shower).  Eating chicken soup or other clear broths, and maintaining good nutrition.  Getting plenty of rest.  Using gargles or lozenges for comfort.  Controlling fevers with ibuprofen or acetaminophen as directed by your caregiver.  Increasing usage of your inhaler if you have asthma. Zinc gel and zinc lozenges, taken in the first 24 hours of the common cold, can shorten the duration and lessen the severity of symptoms. Pain medicines may help with fever, muscle aches, and throat pain. A variety of non-prescription medicines are available to treat congestion and runny nose. Your caregiver   can make recommendations and may suggest nasal or lung inhalers for other symptoms.  HOME CARE INSTRUCTIONS   Only take over-the-counter or prescription medicines for pain, discomfort, or fever as directed by your  caregiver.  Use a warm mist humidifier or inhale steam from a shower to increase air moisture. This may keep secretions moist and make it easier to breathe.  Drink enough water and fluids to keep your urine clear or pale yellow.  Rest as needed.  Return to work when your temperature has returned to normal or as your caregiver advises. You may need to stay home longer to avoid infecting others. You can also use a face mask and careful hand washing to prevent spread of the virus. SEEK MEDICAL CARE IF:   After the first few days, you feel you are getting worse rather than better.  You need your caregiver's advice about medicines to control symptoms.  You develop chills, worsening shortness of breath, or brown or red sputum. These may be signs of pneumonia.  You develop yellow or brown nasal discharge or pain in the face, especially when you bend forward. These may be signs of sinusitis.  You develop a fever, swollen neck glands, pain with swallowing, or white areas in the back of your throat. These may be signs of strep throat. SEEK IMMEDIATE MEDICAL CARE IF:   You have a fever.  You develop severe or persistent headache, ear pain, sinus pain, or chest pain.  You develop wheezing, a prolonged cough, cough up blood, or have a change in your usual mucus (if you have chronic lung disease).  You develop sore muscles or a stiff neck. Document Released: 09/28/2000 Document Revised: 06/27/2011 Document Reviewed: 07/10/2013 [employer] Patient Information 2015 [employer]. This information is not intended to replace advice given to you by your health care provider. Make sure you discuss any questions you have with your health care provider.  

## 2014-04-09 NOTE — Discharge Summary (Signed)
Patient ID: [name] MRN: [id number] DOB/AGE: [birth year] 33 y.o.  Admit date: 03/12/2014 Discharge date: 04/09/2014  Admission Diagnoses:  Active Problems:   S/P cervical spinal fusion   Discharge Diagnoses:  Active Problems:   S/P cervical spinal fusion  status post Procedure(s): ACDF C5-7  Past Medical History  Diagnosis Date  . History of bronchitis 2009  . History of migraine     last one on 03/02/14  . Weakness     tingling and buring in right hand/arm  . Chronic neck pain     DDD/stenosis  . History of kidney stones   . Panic attacks     but doesn't require meds    Surgeries: Procedure(s): ACDF C5-7 on 03/12/2014   Consultants:  none  Discharged Condition: Improved  Hospital Course: Erica RakersShonda T Barrera is an 33 y.o. female who was admitted 03/12/2014 for operative treatment of neck and arm pain. Patient failed conservative treatments (please see the history and physical for the specifics) and had severe unremitting pain that affects sleep, daily activities and work/hobbies. After pre-op clearance, the patient was taken to the operating room on 03/12/2014 and underwent  Procedure(s): ACDF C5-7.    Patient was given perioperative antibiotics:  Anti-infectives    Start     Dose/Rate Route Frequency Ordered Stop   03/12/14 1645  vancomycin (VANCOCIN) IVPB 1000 mg/200 mL premix     1,000 mg200 mL/hr over 60 Minutes Intravenous Every 12 hours 03/12/14 1554 03/12/14 2038   03/11/14 1338  vancomycin (VANCOCIN) 1,500 mg in sodium chloride 0.9 % 500 mL IVPB     1,500 mg250 mL/hr over 120 Minutes Intravenous 120 min pre-op 03/11/14 1338 03/12/14 0925       Patient was given sequential compression devices and early ambulation to prevent DVT.   Patient benefited maximally from hospital stay and there were no complications. At the time of discharge, the patient was urinating/moving their bowels without difficulty, tolerating a regular diet, pain is controlled  with oral pain medications and they have been cleared by PT/OT.   Recent vital signs: No data found.    Recent laboratory studies: No results for input(s): WBC, HGB, HCT, PLT, NA, K, CL, CO2, BUN, CREATININE, GLUCOSE, INR, CALCIUM in the last 72 hours.  Invalid input(s): PT, 2   Discharge Medications:     Medication List    STOP taking these medications        MULTI-VITAMIN GUMMIES Chew      TAKE these medications        HYDROcodone-acetaminophen 10-325 MG per tablet  Commonly known as:  NORCO  Take 1 tablet by mouth every 6 (six) hours as needed.     methocarbamol 500 MG tablet  Commonly known as:  ROBAXIN  Take 1 tablet (500 mg total) by mouth every 6 (six) hours as needed for muscle spasms.        Diagnostic Studies: Dg Cervical Spine 2-3 Views  03/12/2014   CLINICAL DATA:  Status post C5-6 and C6-7 ACDF  EXAM: Three fluoro spot images of today's date  COMPARISON:  None.  FINDINGS: The patient has undergone anterior fusion with intradiscal device placement at C5-6 and C6-7. Radiographic positioning of the metallic hardware is appropriate. The upper cervical spine is unremarkable. The trachea is intubated.  IMPRESSION: The patient has undergone ACDF at C5-6 and C6-7 without immediate postprocedure complication.   Electronically Signed   By: David  Swazil[place]Jordan   On: 03/12/2014 12:22  Dg Chest Port 1 View  03/12/2014   CLINICAL DATA:  Postop from cervical spine fusion. Right-sided chest pain. Cough. Shortness of breath.  EXAM: PORTABLE CHEST - 1 VIEW  COMPARISON:  07/31/2013  FINDINGS: Low lung volumes seen with mild bibasilar atelectasis. No evidence of pneumothorax or pleural effusion. Heart size is within normal limits allowing for low lung volumes.  IMPRESSION: Low lung volumes with bibasilar atelectasis.   Electronically Signed   By: Myles RosenthalJohn  Stahl M.D.   On: 03/12/2014 19:14   Dg C-arm 1-60 Min  03/12/2014   CLINICAL DATA:  Status post C5-6 and C6-7 ACDF  EXAM: Three fluoro  spot images of today's date  COMPARISON:  None.  FINDINGS: The patient has undergone anterior fusion with intradiscal device placement at C5-6 and C6-7. Radiographic positioning of the metallic hardware is appropriate. The upper cervical spine is unremarkable. The trachea is intubated.  IMPRESSION: The patient has undergone ACDF at C5-6 and C6-7 without immediate postprocedure complication.   Electronically Signed   By: David  [place]   On: 03/12/2014 12:22        Discharge Instructions    Call MD / Call 911    Complete by:  As directed   If you experience chest pain or shortness of breath, CALL 911 and be transported to the hospital emergency room.  If you develope a fever above 101 F, pus (white drainage) or increased drainage or redness at the wound, or calf pain, call your surgeon's office.     Constipation Prevention    Complete by:  As directed   Drink plenty of fluids.  Prune juice may be helpful.  You may use a stool softener, such as Colace (over the counter) 100 mg twice a day.  Use MiraLax (over the counter) for constipation as needed.     Diet - low sodium heart healthy    Complete by:  As directed      Driving restrictions    Complete by:  As directed   No driving until further notice.     Increase activity slowly as tolerated    Complete by:  As directed      Lifting restrictions    Complete by:  As directed   No lifting until further notice.           Follow-up Information    Follow up with Alvy BealBROOKS,Din Bookwalter D, MD.   Specialty:  Orthopedic Surgery   Why:  need return office visit 2 weeks postop   Contact information:   [address] [place] [address] [phone]       Discharge Plan:  discharge to home Disposition: doing well.  No swelling at incision site.  Ok for d/c to home f/u in 2 weeks.    Signed: Venita LickBROOKS,Akin Yi D for Dr. Venita Lickahari Elenna [facility]ling [facility] [phone] 04/09/2014, 8:30 AM

## 2014-04-29 ENCOUNTER — Telehealth: Payer: Self-pay | Admitting: Family Medicine

## 2014-04-29 DIAGNOSIS — S93401S Sprain of unspecified ligament of right ankle, sequela: Secondary | ICD-10-CM

## 2014-04-29 NOTE — Telephone Encounter (Signed)
done

## 2014-04-29 NOTE — Telephone Encounter (Signed)
Amelia from [facility] called to say that the patient is having PT right now after her neck surgery and the patient told them that you want her to do therapy for her foot and ankle when she is ready for it. Please fax your Pt order to Attn [facility]ela [facility], [phone]#[phone] and they will take care of scheduling her for what you are requesting that she have.

## 2014-05-02 NOTE — Telephone Encounter (Signed)
Order faxed.

## 2014-06-25 ENCOUNTER — Encounter: Payer: Self-pay | Admitting: Diagnostic Neuroimaging

## 2014-06-25 ENCOUNTER — Ambulatory Visit (INDEPENDENT_AMBULATORY_CARE_PROVIDER_SITE_OTHER): Payer: 59 | Admitting: Diagnostic Neuroimaging

## 2014-06-25 VITALS — BP 109/67 | HR 82 | Ht 65.0 in | Wt 221.6 lb

## 2014-06-25 DIAGNOSIS — G43809 Other migraine, not intractable, without status migrainosus: Secondary | ICD-10-CM

## 2014-06-25 DIAGNOSIS — G4486 Cervicogenic headache: Secondary | ICD-10-CM

## 2014-06-25 DIAGNOSIS — R51 Headache: Secondary | ICD-10-CM | POA: Diagnosis not present

## 2014-06-25 NOTE — Patient Instructions (Signed)
I will check MRI brain.  
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
## 2014-07-10 ENCOUNTER — Ambulatory Visit (INDEPENDENT_AMBULATORY_CARE_PROVIDER_SITE_OTHER): Payer: 59

## 2014-07-10 DIAGNOSIS — G4486 Cervicogenic headache: Secondary | ICD-10-CM

## 2014-07-10 DIAGNOSIS — G43809 Other migraine, not intractable, without status migrainosus: Secondary | ICD-10-CM

## 2014-07-10 DIAGNOSIS — R51 Headache: Secondary | ICD-10-CM | POA: Diagnosis not present

## 2014-07-18 ENCOUNTER — Telehealth: Payer: Self-pay | Admitting: Diagnostic Neuroimaging

## 2014-07-18 NOTE — Telephone Encounter (Signed)
Pt is calling requesting MRI results from 07/10/14.  Please call and advise.

## 2014-07-21 ENCOUNTER — Encounter (HOSPITAL_BASED_OUTPATIENT_CLINIC_OR_DEPARTMENT_OTHER): Payer: Self-pay | Admitting: *Deleted

## 2014-07-21 NOTE — Progress Notes (Signed)
NPO AFTER MN WITH EXCEPTION CLEAR LIQUIDS UNTIL 0730 (NO CREAM/ MILK PRODUCTS).  ARRIVE AT 1300. NEEDS HG AND URINE PREG.

## 2014-07-21 NOTE — Telephone Encounter (Signed)
Spoke with the pt on the phone and informed her that her MRI was normal. Also scheduled a follow-up appt for the pt while we talked. She asked about other treatments and I read her back the note that Dr. Marjory LiesPenumalli had written for her AVS. She stated an understanding and a thank you

## 2014-07-24 ENCOUNTER — Ambulatory Visit (HOSPITAL_BASED_OUTPATIENT_CLINIC_OR_DEPARTMENT_OTHER): Payer: 59 | Admitting: Anesthesiology

## 2014-07-24 ENCOUNTER — Encounter (HOSPITAL_BASED_OUTPATIENT_CLINIC_OR_DEPARTMENT_OTHER): Payer: Self-pay | Admitting: *Deleted

## 2014-07-24 ENCOUNTER — Ambulatory Visit (HOSPITAL_BASED_OUTPATIENT_CLINIC_OR_DEPARTMENT_OTHER)
Admission: RE | Admit: 2014-07-24 | Discharge: 2014-07-24 | Disposition: A | Discharge status: Home / Self Care | Payer: 59 | Source: Ambulatory Visit | Attending: Orthopedic Surgery | Admitting: Orthopedic Surgery

## 2014-07-24 ENCOUNTER — Encounter (HOSPITAL_BASED_OUTPATIENT_CLINIC_OR_DEPARTMENT_OTHER)
Admission: RE | Disposition: A | Discharge status: Home / Self Care | Payer: Self-pay | Source: Ambulatory Visit | Attending: Orthopedic Surgery

## 2014-07-24 DIAGNOSIS — G5601 Carpal tunnel syndrome, right upper limb: Secondary | ICD-10-CM | POA: Diagnosis not present

## 2014-07-24 DIAGNOSIS — M79641 Pain in right hand: Secondary | ICD-10-CM | POA: Diagnosis present

## 2014-07-24 DIAGNOSIS — Z87891 Personal history of nicotine dependence: Secondary | ICD-10-CM | POA: Insufficient documentation

## 2014-07-24 DIAGNOSIS — R200 Anesthesia of skin: Secondary | ICD-10-CM | POA: Diagnosis present

## 2014-07-24 HISTORY — PX: CARPAL TUNNEL RELEASE: SHX101

## 2014-07-24 HISTORY — DX: Headache, unspecified: R51.9

## 2014-07-24 HISTORY — DX: Headache: R51

## 2014-07-24 HISTORY — DX: Personal history of other mental and behavioral disorders: Z86.59

## 2014-07-24 HISTORY — DX: Carpal tunnel syndrome, right upper limb: G56.01

## 2014-07-24 LAB — POCT HEMOGLOBIN-HEMACUE: Hemoglobin: 13.9 g/dL (ref 12.0–15.0)

## 2014-07-24 LAB — POCT PREGNANCY, URINE: Preg Test, Ur: NEGATIVE

## 2014-07-24 SURGERY — CARPAL TUNNEL RELEASE
Anesthesia: Monitor Anesthesia Care | Site: Wrist | Laterality: Right

## 2014-07-24 MED ORDER — PROMETHAZINE HCL 25 MG/ML IJ SOLN
6.2500 mg | INTRAMUSCULAR | Status: DC | PRN
  Filled 2014-07-24: qty 1

## 2014-07-24 MED ORDER — LACTATED RINGERS IV SOLN
INTRAVENOUS | Status: DC
  Administered 2014-07-24: 13:00:00 via INTRAVENOUS
  Filled 2014-07-24: qty 1000

## 2014-07-24 MED ORDER — HYDROCODONE-ACETAMINOPHEN 5-325 MG PO TABS
ORAL_TABLET | ORAL | Status: AC
  Filled 2014-07-24: qty 1

## 2014-07-24 MED ORDER — FENTANYL CITRATE 0.05 MG/ML IJ SOLN
25.0000 ug | INTRAMUSCULAR | Status: DC | PRN
Start: 2014-07-24 — End: 2014-07-24
  Filled 2014-07-24: qty 1

## 2014-07-24 MED ORDER — LIDOCAINE HCL (PF) 1 % IJ SOLN
INTRAMUSCULAR | Status: DC | PRN
  Administered 2014-07-24: 9 mL

## 2014-07-24 MED ORDER — HYDROCODONE-ACETAMINOPHEN 5-325 MG PO TABS
1.0000 | ORAL_TABLET | Freq: Four times a day (QID) | ORAL | Status: DC | PRN
  Administered 2014-07-24: 1 via ORAL
  Filled 2014-07-24: qty 1

## 2014-07-24 MED ORDER — DEXAMETHASONE SODIUM PHOSPHATE 4 MG/ML IJ SOLN
INTRAMUSCULAR | Status: DC | PRN
  Administered 2014-07-24: 10 mg via INTRAVENOUS

## 2014-07-24 MED ORDER — LIDOCAINE HCL (CARDIAC) 20 MG/ML IV SOLN
INTRAVENOUS | Status: DC | PRN
  Administered 2014-07-24: 50 mg via INTRAVENOUS

## 2014-07-24 MED ORDER — FENTANYL CITRATE 0.05 MG/ML IJ SOLN
INTRAMUSCULAR | Status: AC
  Filled 2014-07-24: qty 2

## 2014-07-24 MED ORDER — CLINDAMYCIN PHOSPHATE 900 MG/50ML IV SOLN
900.0000 mg | INTRAVENOUS | Status: AC
  Administered 2014-07-24: 900 mg via INTRAVENOUS
  Filled 2014-07-24: qty 50

## 2014-07-24 MED ORDER — ONDANSETRON HCL 4 MG/2ML IJ SOLN
INTRAMUSCULAR | Status: DC | PRN
  Administered 2014-07-24: 4 mg via INTRAVENOUS

## 2014-07-24 MED ORDER — FENTANYL CITRATE 0.05 MG/ML IJ SOLN
INTRAMUSCULAR | Status: DC | PRN
  Administered 2014-07-24: 25 ug via INTRAVENOUS
  Administered 2014-07-24: 50 ug via INTRAVENOUS
  Administered 2014-07-24: 25 ug via INTRAVENOUS

## 2014-07-24 MED ORDER — KETOROLAC TROMETHAMINE 30 MG/ML IJ SOLN
30.0000 mg | Freq: Once | INTRAMUSCULAR | Status: DC | PRN
Start: 2014-07-24 — End: 2014-07-24
  Filled 2014-07-24: qty 1

## 2014-07-24 MED ORDER — PROPOFOL 10 MG/ML IV EMUL
INTRAVENOUS | Status: DC | PRN
  Administered 2014-07-24: 100 ug/kg/min via INTRAVENOUS

## 2014-07-24 MED ORDER — HYDROCODONE-ACETAMINOPHEN 5-325 MG PO TABS: 1.0000 | ORAL_TABLET | Freq: Four times a day (QID) | ORAL | Status: DC | PRN

## 2014-07-24 MED ORDER — CHLORHEXIDINE GLUCONATE 4 % EX LIQD
60.0000 mL | Freq: Once | CUTANEOUS | Status: DC
  Filled 2014-07-24: qty 60

## 2014-07-24 MED ORDER — BUPIVACAINE HCL (PF) 0.25 % IJ SOLN
INTRAMUSCULAR | Status: DC | PRN
  Administered 2014-07-24: 9 mL

## 2014-07-24 MED ORDER — MIDAZOLAM HCL 5 MG/5ML IJ SOLN
INTRAMUSCULAR | Status: DC | PRN
  Administered 2014-07-24: 2 mg via INTRAVENOUS

## 2014-07-24 MED ORDER — CLINDAMYCIN PHOSPHATE 900 MG/50ML IV SOLN
INTRAVENOUS | Status: AC
  Filled 2014-07-24: qty 50

## 2014-07-24 MED ORDER — MIDAZOLAM HCL 2 MG/2ML IJ SOLN
INTRAMUSCULAR | Status: AC
Start: 2014-07-24 — End: 2014-07-24
  Filled 2014-07-24: qty 2

## 2014-07-24 SURGICAL SUPPLY — 41 items
BANDAGE ELASTIC 3 VELCRO ST LF (GAUZE/BANDAGES/DRESSINGS) ×3 IMPLANT
BLADE SURG 15 STRL LF DISP TIS (BLADE) ×1 IMPLANT
BLADE SURG 15 STRL SS (BLADE) ×2
BNDG CONFORM 3 STRL LF (GAUZE/BANDAGES/DRESSINGS) ×3 IMPLANT
BNDG ESMARK 4X9 LF (GAUZE/BANDAGES/DRESSINGS) ×3 IMPLANT
BNDG GAUZE ELAST 4 BULKY (GAUZE/BANDAGES/DRESSINGS) ×3 IMPLANT
CORDS BIPOLAR (ELECTRODE) ×3 IMPLANT
COVER BACK TABLE 60X90IN (DRAPES) ×3 IMPLANT
CUFF TOURNIQUET SINGLE 18IN (TOURNIQUET CUFF) ×3 IMPLANT
DRAPE EXTREMITY T 121X128X90 (DRAPE) ×3 IMPLANT
DRAPE LG THREE QUARTER DISP (DRAPES) ×3 IMPLANT
DRAPE SURG 17X23 STRL (DRAPES) ×3 IMPLANT
DRSG EMULSION OIL 3X3 NADH (GAUZE/BANDAGES/DRESSINGS) ×3 IMPLANT
GAUZE XEROFORM 1X8 LF (GAUZE/BANDAGES/DRESSINGS) ×3 IMPLANT
GLOVE BIO SURGEON STRL SZ8 (GLOVE) ×3 IMPLANT
GLOVE BIOGEL M 6.5 STRL (GLOVE) ×3 IMPLANT
GLOVE BIOGEL PI IND STRL 6.5 (GLOVE) ×1 IMPLANT
GLOVE BIOGEL PI IND STRL 7.5 (GLOVE) ×1 IMPLANT
GLOVE BIOGEL PI IND STRL 8.5 (GLOVE) ×1 IMPLANT
GLOVE BIOGEL PI INDICATOR 6.5 (GLOVE) ×2
GLOVE BIOGEL PI INDICATOR 7.5 (GLOVE) ×2
GLOVE BIOGEL PI INDICATOR 8.5 (GLOVE) ×2
GOWN STRL REUS W/ TWL LRG LVL3 (GOWN DISPOSABLE) ×2 IMPLANT
GOWN STRL REUS W/TWL LRG LVL3 (GOWN DISPOSABLE) ×4
KNIFE CARPAL TUNNEL (BLADE) IMPLANT
NDL SAFETY ECLIPSE 18X1.5 (NEEDLE) IMPLANT
NEEDLE HYPO 18GX1.5 SHARP (NEEDLE)
NEEDLE HYPO 25X1 1.5 SAFETY (NEEDLE) ×6 IMPLANT
NS IRRIG 500ML POUR BTL (IV SOLUTION) ×3 IMPLANT
PACK BASIN DAY SURGERY FS (CUSTOM PROCEDURE TRAY) ×3 IMPLANT
PAD ALCOHOL SWAB (MISCELLANEOUS) ×12 IMPLANT
PAD CAST 3X4 CTTN HI CHSV (CAST SUPPLIES) IMPLANT
PADDING CAST COTTON 3X4 STRL (CAST SUPPLIES)
SPONGE GAUZE 4X4 12PLY STER LF (GAUZE/BANDAGES/DRESSINGS) ×3 IMPLANT
STOCKINETTE 4X48 STRL (DRAPES) ×3 IMPLANT
SUT PROLENE 4 0 PS 2 18 (SUTURE) ×6 IMPLANT
SYR BULB 3OZ (MISCELLANEOUS) ×3 IMPLANT
SYR CONTROL 10ML LL (SYRINGE) ×6 IMPLANT
TOWEL OR 17X24 6PK STRL BLUE (TOWEL DISPOSABLE) ×6 IMPLANT
TRAY DSU PREP LF (CUSTOM PROCEDURE TRAY) ×3 IMPLANT
UNDERPAD 30X30 INCONTINENT (UNDERPADS AND DIAPERS) ×3 IMPLANT

## 2014-07-24 NOTE — H&P (Signed)
Ladonna SnideShonda T Pun is an 34 y.o. female.   Chief Complaint: RIGHT HAND PAIN AND NUMBNESS HPI: PT FOLLOWED IN OFFICE PT WITH RIGHT HAND CARPAL TUNNEL SYNDROME PT HERE FOR SURGERY NO PRIOR SURGERY TO RIGHT HAND  Past Medical History  Diagnosis Date  . History of kidney stones   . Right carpal tunnel syndrome   . Generalized headache   . History of panic attacks     Past Surgical History  Procedure Laterality Date  . Anterior cervical decomp/discectomy fusion N/A 03/12/2014    Procedure: ACDF C5-7;  Surgeon: Venita Lickahari Brooks, MD;  Location: [facility] OR;  Service: Orthopedics;  Laterality: N/A;  . Wisdom tooth extraction  age 34    Family History  Problem Relation Age of Onset  . Hypertension Mother   . Graves' disease Brother    Social History:  reports that she quit smoking about 17 years ago. Her smoking use included Cigarettes. She quit after 3 years of use. She has never used smokeless tobacco. She reports that she drinks about 0.5 oz of alcohol per week. She reports that she does not use illicit drugs.  Allergies:  Allergies  Allergen Reactions  . Percocet [Oxycodone-Acetaminophen] Anaphylaxis, Hives and Swelling  . Topamax [Topiramate] Other (See Comments)    Dry mouth,chills,hot flashes,nauseated  . Penicillins Rash    No prescriptions prior to admission    No results found for this or any previous visit (from the past 48 hour(s)). No results found.  ROS NO RECENT ILLNESSES OR HOSPITALIZATIONS  Height 5\' 5"  (1.651 m), weight 97.977 kg (216 lb), last menstrual period 07/14/2014. Physical Exam  General Appearance:  Alert, cooperative, no distress, appears stated age  Head:  Normocephalic, without obvious abnormality, atraumatic  Eyes:  Pupils equal, conjunctiva/corneas clear,         Throat: Lips, mucosa, and tongue normal; teeth and gums normal  Neck: No visible masses     Lungs:   respirations unlabored  Chest Wall:  No tenderness or deformity  Heart:  Regular rate  and rhythm,  Abdomen:   Soft, non-tender,         Extremities: RIGHT WRIST: SKIN INTACT, FINGERS WARM WELL PERFUSED GOOD THUMB MOBILITY +CARPAL TUNNEL COMPRESSION TEST GOOD DIGITAL MOBILITY  Pulses: 2+ and symmetric  Skin: Skin color, texture, turgor normal, no rashes or lesions     Neurologic: Normal    Assessment/Plan RIGHT HAND CARPAL TUNNEL SYNDROME  RIGHT HAND CARPAL TUNNEL RELEASE  R/B/A DISCUSSED WITH PT IN OFFICE.  PT VOICED UNDERSTANDING OF PLAN CONSENT SIGNED DAY OF SURGERY PT SEEN AND EXAMINED PRIOR TO OPERATIVE PROCEDURE/DAY OF SURGERY SITE MARKED. QUESTIONS ANSWERED WILL GO HOME FOLLOWING SURGERY  WE ARE PLANNING SURGERY FOR YOUR UPPER EXTREMITY. THE RISKS AND BENEFITS OF SURGERY INCLUDE BUT NOT LIMITED TO BLEEDING INFECTION, DAMAGE TO NEARBY NERVES ARTERIES TENDONS, FAILURE OF SURGERY TO ACCOMPLISH ITS INTENDED GOALS, PERSISTENT SYMPTOMS AND NEED FOR FURTHER SURGICAL INTERVENTION. WITH THIS IN MIND WE WILL PROCEED. I HAVE DISCUSSED WITH THE PATIENT THE PRE AND POSTOPERATIVE REGIMEN AND THE DOS AND DON'TS. PT VOICED UNDERSTANDING AND INFORMED CONSENT SIGNED.  Sharma CovertTMANN,Shaunika Italiano W 07/24/2014, 1345

## 2014-07-24 NOTE — Transfer of Care (Signed)
Immediate Anesthesia Transfer of Care Note  Patient: Erica SnideShonda T Barrera  Procedure(s) Performed: Procedure(s) (LRB): RIGHT CARPAL TUNNEL RELEASE (Right)  Patient Location: PACU  Anesthesia Type:MAC  Level of Consciousness: awake, alert  and oriented  Airway & Oxygen Therapy: Patient Spontanous Breathing and Patient connected to nasal cannula oxygen  Post-op Assessment: Report given to PACU RN and Post -op Vital signs reviewed and stable  Post vital signs: Reviewed and stable  Complications: No apparent anesthesia complications Last Vitals:  Filed Vitals:   07/24/14 1239  BP: 116/68  Pulse: 68  Temp: 36.7 C  Resp: 16

## 2014-07-24 NOTE — Brief Op Note (Signed)
07/24/2014  8:02 AM  PATIENT:  Erica SnideShonda T Barrera  34 y.o. female  PRE-OPERATIVE DIAGNOSIS:  RIGHT HAND CARPAL TUNNEL SYNDROME   POST-OPERATIVE DIAGNOSIS:  * No post-op diagnosis entered *  PROCEDURE:  Procedure(s): RIGHT CARPAL TUNNEL RELEASE (Right)  SURGEON:  Surgeon(s) and Role:    * Bradly BienenstockFred Nazariah Cadet, MD - Primary  PHYSICIAN ASSISTANT:   ASSISTANTS: none   ANESTHESIA:   MAC  EBL:     BLOOD ADMINISTERED:none  DRAINS: none   LOCAL MEDICATIONS USED:  MARCAINE     SPECIMEN:  No Specimen  DISPOSITION OF SPECIMEN:  N/A  COUNTS:  YES  TOURNIQUET:  * No tourniquets in log *  DICTATION: .Other Dictation: Dictation Number [id number]  PLAN OF CARE: Discharge to home after PACU  PATIENT DISPOSITION:  PACU - hemodynamically stable.   Delay start of Pharmacological VTE agent (>24hrs) due to surgical blood loss or risk of bleeding: not applicable

## 2014-07-24 NOTE — Discharge Instructions (Signed)
KEEP BANDAGE CLEAN AND DRY CALL OFFICE FOR F/U APPT [phone] i 14 days Dr Melvyn Novasortmann cell [phone] KEEP HAND ELEVATED ABOVE HEART OK TO APPLY ICE TO OPERATIVE AREA CONTACT OFFICE IF ANY WORSENING PAIN OR CONCERNS. Post Anesthesia Home Care Instructions  Activity: Get plenty of rest for the remainder of the day. A responsible adult should stay with you for 24 hours following the procedure.  For the next 24 hours, DO NOT: -Drive a car -Advertising copywriterperate machinery -Drink alcoholic beverages -Take any medication unless instructed by your physician -Make any legal decisions or sign important papers.  Meals: Start with liquid foods such as gelatin or soup. Progress to regular foods as tolerated. Avoid greasy, spicy, heavy foods. If nausea and/or vomiting occur, drink only clear liquids until the nausea and/or vomiting subsides. Call your physician if vomiting continues.  Special Instructions/Symptoms: Your throat may feel dry or sore from the anesthesia or the breathing tube placed in your throat during surgery. If this causes discomfort, gargle with warm salt water. The discomfort should disappear within 24 hours.  If you had a scopolamine patch placed behind your ear for the management of post- operative nausea and/or vomiting:  1. The medication in the patch is effective for 72 hours, after which it should be removed.  Wrap patch in a tissue and discard in the trash. Wash hands thoroughly with soap and water. 2. You may remove the patch earlier than 72 hours if you experience unpleasant side effects which may include dry mouth, dizziness or visual disturbances. 3. Avoid touching the patch. Wash your hands with soap and water after contact with the patch.

## 2014-07-24 NOTE — Anesthesia Procedure Notes (Signed)
Procedure Name: MAC Date/Time: 07/24/2014 2:05 PM Performed by: Norva PavlovALLAWAY, Donatello Kleve G Pre-anesthesia Checklist: Patient identified, Timeout performed, Emergency Drugs available, Suction available and Patient being monitored Patient Re-evaluated:Patient Re-evaluated prior to inductionOxygen Delivery Method: Nasal cannula Placement Confirmation: positive ETCO2

## 2014-07-24 NOTE — Anesthesia Postprocedure Evaluation (Signed)
  Anesthesia Post-op Note  Patient: [name]  Procedure(s) Performed: Procedure(s) (LRB): RIGHT CARPAL TUNNEL RELEASE (Right)  Patient Location: PACU  Anesthesia Type: MAC  Level of Consciousness: awake and alert   Airway and Oxygen Therapy: Patient Spontanous Breathing  Post-op Pain: mild  Post-op Assessment: Post-op Vital signs reviewed, Patient's Cardiovascular Status Stable, Respiratory Function Stable, Patent Airway and No signs of Nausea or vomiting  Last Vitals:  Filed Vitals:   07/24/14 1500  BP: 109/62  Pulse: 74  Temp:   Resp: 18    Post-op Vital Signs: stable   Complications: No apparent anesthesia complications

## 2014-07-24 NOTE — Anesthesia Preprocedure Evaluation (Signed)
Anesthesia Evaluation  Patient identified by MRN, date of birth, ID band Patient awake    Reviewed: Allergy & Precautions, NPO status , Patient's Chart, lab work & pertinent test results  Airway Mallampati: II  TM Distance: >3 FB Neck ROM: Full    Dental no notable dental hx.    Pulmonary neg pulmonary ROS, former smoker,  breath sounds clear to auscultation  Pulmonary exam normal       Cardiovascular negative cardio ROS  Rhythm:Regular Rate:Normal     Neuro/Psych Anxiety negative neurological ROS     GI/Hepatic negative GI ROS, Neg liver ROS,   Endo/Other  negative endocrine ROS  Renal/GU negative Renal ROS  negative genitourinary   Musculoskeletal negative musculoskeletal ROS (+)   Abdominal   Peds negative pediatric ROS (+)  Hematology negative hematology ROS (+)   Anesthesia Other Findings   Reproductive/Obstetrics negative OB ROS                             Anesthesia Physical Anesthesia Plan  ASA: II  Anesthesia Plan: MAC   Post-op Pain Management:    Induction: Intravenous  Airway Management Planned: Simple Face Mask  Additional Equipment:   Intra-op Plan:   Post-operative Plan:   Informed Consent: I have reviewed the patients History and Physical, chart, labs and discussed the procedure including the risks, benefits and alternatives for the proposed anesthesia with the patient or authorized representative who has indicated his/her understanding and acceptance.   Dental advisory given  Plan Discussed with: CRNA and Surgeon  Anesthesia Plan Comments:         Anesthesia Quick Evaluation

## 2014-07-25 ENCOUNTER — Encounter (HOSPITAL_BASED_OUTPATIENT_CLINIC_OR_DEPARTMENT_OTHER): Payer: Self-pay | Admitting: Orthopedic Surgery

## 2014-07-25 NOTE — Op Note (Signed)
NAMEGRENDA, LORA               ACCOUNT NO.:  [id number]  MEDICAL RECORD NO.:  [id number]  LOCATION:                                 FACILITY:  PHYSICIAN:  Madelynn Done, MD  DATE OF BIRTH:  [birth year]  DATE OF PROCEDURE:  07/24/2014 DATE OF DISCHARGE:  07/24/2014                              OPERATIVE REPORT   PREOPERATIVE DIAGNOSIS:  Right hand carpal tunnel syndrome.  POSTOPERATIVE DIAGNOSIS:  Right hand carpal tunnel syndrome.  SURGEON:  Madelynn Done, MD, who was scrubbed and present for the entire procedure.  ASSISTANT SURGEON:  None.  ANESTHESIA:  Xylocaine 1% and 0.25% Marcaine local block with IV sedation.  SURGICAL PROCEDURE:  Right hand carpal tunnel release.  SURGICAL INDICATION:  Ms. Soliman is a right-hand-dominant female with signs and symptoms consistent with right hand carpal tunnel syndrome. She has been refractory to conservative treatment.  Risks, benefits, and alternatives were discussed in detail with the patient, and signed informed consent was obtained.  Risks include, but not limited to bleeding; infection; damage to nearby nerves, arteries, tendons; loss of motion wrist and digits; incomplete relief of symptoms; and need for further surgical intervention.  DESCRIPTION OF PROCEDURE:  The patient was properly identified in the preop holding area and marked with a permanent marker made on the right wrist to indicate correct operative site.  The patient was then brought back to the operating room, placed supine on anesthesia table where the IV sedation was administered.  The patient tolerated this well.  A well- padded tourniquet was then placed on the right forearm and sealed with 1000 drape.  The right upper extremity was then prepped and draped in normal sterile fashion.  Time-out was called and correct site was identified, and procedure then begun.  A local anesthetic had been administered.  Limb had been elevated and tourniquet  insufflated. Several centimeters of incision was made directly in the mid palm.  Deep dissection carried down to the palmar fascia, was incised longitudinally.  Direct exposure of the transverse carpal ligament was then carried out under direct visualization, distal one-half of the transverse carpal ligament released in its entirety.  Further exposure was then carried out proximally on the remaining course of transverse carpal ligament as well as portion of the antebrachial fascia was then released.  This was done with careful protection of the median nerve. There appeared to be rather significant compression of the nerve upon release.  Tourniquet deflated.  Hemostasis was obtained.  Contents of carpal canal were then inspected, no other abnormalities were noted. The wound was then thoroughly irrigated.  After thorough wound irrigation, the skin was then closed using horizontal mattress Prolene sutures.  Adaptic dressing, sterile compressive bandage were applied. The patient tolerated the procedure well and returned to the recovery room in good condition.  POSTPROCEDURE PLAN:  Patient to be discharged home, seen back in the office in approximately 2 weeks for wound check, suture removal, and transition to a padded glove, and then begin postoperative carpal tunnel eval and treat.     Madelynn Done, MD     FWO/MEDQ  D:  07/24/2014  T:  07/25/2014  Job:  [id number]

## 2014-08-07 ENCOUNTER — Encounter (HOSPITAL_BASED_OUTPATIENT_CLINIC_OR_DEPARTMENT_OTHER): Payer: Self-pay | Admitting: Orthopedic Surgery

## 2014-09-29 ENCOUNTER — Ambulatory Visit: Payer: Self-pay | Admitting: Diagnostic Neuroimaging

## 2014-10-01 ENCOUNTER — Ambulatory Visit (INDEPENDENT_AMBULATORY_CARE_PROVIDER_SITE_OTHER): Payer: 59 | Admitting: Diagnostic Neuroimaging

## 2014-10-01 ENCOUNTER — Encounter: Payer: Self-pay | Admitting: Diagnostic Neuroimaging

## 2014-10-01 VITALS — BP 110/65 | HR 83 | Ht 65.0 in | Wt 217.0 lb

## 2014-10-01 DIAGNOSIS — G44329 Chronic post-traumatic headache, not intractable: Secondary | ICD-10-CM | POA: Diagnosis not present

## 2014-10-01 DIAGNOSIS — G4486 Cervicogenic headache: Secondary | ICD-10-CM

## 2014-10-01 DIAGNOSIS — R51 Headache: Secondary | ICD-10-CM
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
## 2014-10-01 NOTE — Patient Instructions (Signed)
Follow up as needed

## 2014-10-07 ENCOUNTER — Encounter: Payer: Self-pay | Admitting: Family Medicine

## 2014-10-07 ENCOUNTER — Ambulatory Visit: Payer: 59 | Admitting: Family Medicine

## 2014-10-07 ENCOUNTER — Ambulatory Visit (INDEPENDENT_AMBULATORY_CARE_PROVIDER_SITE_OTHER): Payer: 59 | Admitting: Family Medicine

## 2014-10-07 VITALS — BP 118/64 | HR 73 | Temp 97.7°F | Ht 65.0 in | Wt 216.2 lb

## 2014-10-07 DIAGNOSIS — F410 Panic disorder [episodic paroxysmal anxiety] without agoraphobia: Secondary | ICD-10-CM

## 2014-10-07 DIAGNOSIS — F411 Generalized anxiety disorder: Secondary | ICD-10-CM | POA: Insufficient documentation

## 2014-10-07 DIAGNOSIS — F4310 Post-traumatic stress disorder, unspecified: Secondary | ICD-10-CM

## 2014-10-07 MED ORDER — ESCITALOPRAM OXALATE 10 MG PO TABS: 10.0000 mg | ORAL_TABLET | Freq: Every day | ORAL | Status: AC

## 2014-10-07 NOTE — Progress Notes (Signed)
Pre visit review using our clinic review tool, if applicable. No additional management support is needed unless otherwise documented below in the visit note. 
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
## 2014-10-07 NOTE — Assessment & Plan Note (Signed)
Encouraged exercise, healthy sleeping habits. Refer to psychology and start lexapro. Follow up in 4 weeks for re-eval.

## 2014-10-07 NOTE — Assessment & Plan Note (Signed)
Treat with relaxation techniques.

## 2014-10-07 NOTE — Patient Instructions (Addendum)
Start lexapro at bedtime. Stop at front desk to set up referral to psychologist.

## 2014-11-04 ENCOUNTER — Ambulatory Visit (INDEPENDENT_AMBULATORY_CARE_PROVIDER_SITE_OTHER): Payer: 59 | Admitting: Family Medicine

## 2014-11-04 ENCOUNTER — Encounter: Payer: Self-pay | Admitting: Family Medicine

## 2014-11-04 VITALS — BP 100/50 | HR 72 | Temp 97.9°F | Ht 65.0 in | Wt 212.2 lb

## 2014-11-04 DIAGNOSIS — F410 Panic disorder [episodic paroxysmal anxiety] without agoraphobia: Secondary | ICD-10-CM

## 2014-11-04 DIAGNOSIS — F411 Generalized anxiety disorder: Secondary | ICD-10-CM

## 2014-11-04 NOTE — Progress Notes (Signed)
Pre visit review using our clinic review tool, if applicable. No additional management support is needed unless otherwise documented below in the visit note. 

## 2014-11-04 NOTE — Progress Notes (Signed)
   Subjective:    Patient ID: [name], female    DOB: [birth year], 34 y.o.   MRN: [id number]  HPI  34 year old female with history of depression, panic attacks, PTSD and generalized anxiety presents  For follow up of her mood. She is currently on lexapro 10 mg daily since last OV on 10/07/2014.  Referred to counseling at that time as well.   Today she reports she had some initial SE of nausea, now better.She has first visit with counselor next week. She reports she has had improvement in amount of panic attack. Only one in last month, less severe as she was able to calm down quickly. She is 25 % improved. Sleeping better at night.  Reviewed PHQ9.   She would like to hold off on increase of med at this time and work on counseling first.   RIght arm arm when she has had carpal tunnel is not feeling well, having pain and swelling. Followed by Dr. Orlan Leavensrtman. Has appt next week.  Review of Systems  Constitutional: Negative for fever and fatigue.  HENT: Negative for ear pain.   Eyes: Negative for pain.  Respiratory: Negative for cough and shortness of breath.   Cardiovascular: Negative for chest pain.  Gastrointestinal: Negative for abdominal pain.       Objective:   Physical Exam  Constitutional: Vital signs are normal. She appears well-developed and well-nourished. She is cooperative.  Non-toxic appearance. She does not appear ill. No distress.  HENT:  Head: Normocephalic.  Right Ear: Hearing, tympanic membrane, external ear and ear canal normal. Tympanic membrane is not erythematous, not retracted and not bulging.  Left Ear: Hearing, tympanic membrane, external ear and ear canal normal. Tympanic membrane is not erythematous, not retracted and not bulging.  Nose: No mucosal edema or rhinorrhea. Right sinus exhibits no maxillary sinus tenderness and no frontal sinus tenderness. Left sinus exhibits no maxillary sinus tenderness and no frontal sinus tenderness.  Mouth/Throat:  Uvula is midline, oropharynx is clear and moist and mucous membranes are normal.  Eyes: Conjunctivae, EOM and lids are normal. Pupils are equal, round, and reactive to light. Lids are everted and swept, no foreign bodies found.  Neck: Trachea normal and normal range of motion. Neck supple. Carotid bruit is not present. No thyroid mass and no thyromegaly present.  Cardiovascular: Normal rate, regular rhythm, S1 normal, S2 normal, normal heart sounds, intact distal pulses and normal pulses.  Exam reveals no gallop and no friction rub.   No murmur heard. Pulmonary/Chest: Effort normal and breath sounds normal. No tachypnea. No respiratory distress. She has no decreased breath sounds. She has no wheezes. She has no rhonchi. She has no rales.  Abdominal: Soft. Normal appearance and bowel sounds are normal. There is no tenderness.  Neurological: She is alert.  Skin: Skin is warm, dry and intact. No rash noted.  Psychiatric: Her speech is normal and behavior is normal. Judgment and thought content normal. Her mood appears not anxious. Cognition and memory are normal. She does not exhibit a depressed mood.          Assessment & Plan:

## 2014-11-04 NOTE — Assessment & Plan Note (Signed)
Improving with mood  on lexapro. Continue. Add counseling. If not continuing to improve consider increase of lexapro to 20 mg daily. Pt will contact office in this instance.

## 2014-11-04 NOTE — Patient Instructions (Addendum)
Continue current dose of lexapro.  Keep appt with counseling  Call if mood not continuing to improve and interested in increasing lexapro.

## 2014-11-11 ENCOUNTER — Ambulatory Visit (INDEPENDENT_AMBULATORY_CARE_PROVIDER_SITE_OTHER): Payer: 59 | Admitting: Psychology

## 2014-11-11 DIAGNOSIS — F4310 Post-traumatic stress disorder, unspecified: Secondary | ICD-10-CM | POA: Diagnosis not present

## 2014-11-19 ENCOUNTER — Ambulatory Visit (INDEPENDENT_AMBULATORY_CARE_PROVIDER_SITE_OTHER): Payer: 59 | Admitting: Psychology

## 2014-11-19 DIAGNOSIS — F4323 Adjustment disorder with mixed anxiety and depressed mood: Secondary | ICD-10-CM | POA: Diagnosis not present

## 2014-12-03 ENCOUNTER — Ambulatory Visit (INDEPENDENT_AMBULATORY_CARE_PROVIDER_SITE_OTHER): Payer: 59 | Admitting: Psychology

## 2014-12-03 DIAGNOSIS — F4310 Post-traumatic stress disorder, unspecified: Secondary | ICD-10-CM

## 2014-12-05 ENCOUNTER — Encounter: Payer: Self-pay | Admitting: Family Medicine

## 2014-12-05 ENCOUNTER — Ambulatory Visit (INDEPENDENT_AMBULATORY_CARE_PROVIDER_SITE_OTHER): Payer: 59 | Admitting: Family Medicine

## 2014-12-05 VITALS — BP 100/60 | HR 80 | Temp 98.3°F | Ht 65.0 in | Wt 215.2 lb

## 2014-12-05 DIAGNOSIS — G4700 Insomnia, unspecified: Secondary | ICD-10-CM

## 2014-12-05 DIAGNOSIS — F5104 Psychophysiologic insomnia: Secondary | ICD-10-CM | POA: Insufficient documentation

## 2014-12-05 DIAGNOSIS — J069 Acute upper respiratory infection, unspecified: Secondary | ICD-10-CM

## 2014-12-05 DIAGNOSIS — B9789 Other viral agents as the cause of diseases classified elsewhere: Secondary | ICD-10-CM

## 2014-12-05 MED ORDER — TRAZODONE HCL 50 MG PO TABS
25.0000 mg | ORAL_TABLET | Freq: Every evening | ORAL | Status: AC | PRN
Start: 2014-12-05 — End: ?

## 2014-12-05 MED ORDER — BENZONATATE 200 MG PO CAPS: 200.0000 mg | ORAL_CAPSULE | Freq: Two times a day (BID) | ORAL | Status: AC | PRN

## 2014-12-05 NOTE — Assessment & Plan Note (Signed)
Symptomatic care 

## 2014-12-05 NOTE — Patient Instructions (Addendum)
Continue lexapro.  Start trazodone at night 50 mg a bedtime.  Work on Tree surgeon. Can use mucinex DM for the cough. Can use benzonatate for coughing fits and can use at bedtime.

## 2014-12-05 NOTE — Progress Notes (Addendum)
   Subjective:    Patient ID: [name], female    DOB: 10-27-1980, 34 y.o.   MRN: [id number]  Cough This is a new problem. The current episode started in the past 7 days. The problem occurs constantly. The cough is productive of sputum. Pertinent negatives include no chest pain, chills, ear congestion, ear pain, fever, myalgias, nasal congestion, postnasal drip, sore throat, shortness of breath or wheezing. The symptoms are aggravated by lying down. Risk factors: nonsmoker. She has tried OTC cough suppressant for the symptoms. The treatment provided no relief. There is no history of asthma, bronchitis, COPD, environmental allergies or pneumonia.   Mother is a sick contact.  She has also been on lexapro 10 mg daily for GAD , panic disorder for 2 month. She has been having trouble sleeping. Mind racing, won't stop at night, trouble falling alseep.  Frequent waking thorough the night.  Mood doing well. She has been going to a Veterinary surgeon. They recommended sleep aide.  She has never been on a med before.    Review of Systems  Constitutional: Negative for fever and chills.  HENT: Negative for ear pain, postnasal drip and sore throat.   Respiratory: Positive for cough. Negative for shortness of breath and wheezing.   Cardiovascular: Negative for chest pain.  Musculoskeletal: Negative for myalgias.  Allergic/Immunologic: Negative for environmental allergies.       Objective:   Physical Exam  Constitutional: Vital signs are normal. She appears well-developed and well-nourished. She is cooperative.  Non-toxic appearance. She does not appear ill. No distress.  HENT:  Head: Normocephalic.  Right Ear: Hearing, tympanic membrane, external ear and ear canal normal. Tympanic membrane is not erythematous, not retracted and not bulging.  Left Ear: Hearing, tympanic membrane, external ear and ear canal normal. Tympanic membrane is not erythematous, not retracted and not bulging.  Nose: Mucosal  edema and rhinorrhea present. Right sinus exhibits no maxillary sinus tenderness and no frontal sinus tenderness. Left sinus exhibits no maxillary sinus tenderness and no frontal sinus tenderness.  Mouth/Throat: Uvula is midline, oropharynx is clear and moist and mucous membranes are normal.  Eyes: Conjunctivae, EOM and lids are normal. Pupils are equal, round, and reactive to light. Lids are everted and swept, no foreign bodies found.  Neck: Trachea normal and normal range of motion. Neck supple. Carotid bruit is not present. No thyroid mass and no thyromegaly present.  Cardiovascular: Normal rate, regular rhythm, S1 normal, S2 normal, normal heart sounds, intact distal pulses and normal pulses.  Exam reveals no gallop and no friction rub.   No murmur heard. Pulmonary/Chest: Effort normal and breath sounds normal. No tachypnea. No respiratory distress. She has no decreased breath sounds. She has no wheezes. She has no rhonchi. She has no rales.  Neurological: She is alert.  Skin: Skin is warm, dry and intact. No rash noted.  Psychiatric: Her speech is normal and behavior is normal. Judgment normal. Her mood appears not anxious. Cognition and memory are normal. She does not exhibit a depressed mood.          Assessment & Plan:

## 2014-12-05 NOTE — Progress Notes (Signed)
Pre visit review using our clinic review tool, if applicable. No additional management support is needed unless otherwise documented below in the visit note. 

## 2014-12-05 NOTE — Assessment & Plan Note (Signed)
Mood well controlled. Will treat with trial of trazodone. Healthy sleep hygiene.

## 2014-12-09 ENCOUNTER — Telehealth: Payer: Self-pay

## 2014-12-09 DIAGNOSIS — J069 Acute upper respiratory infection, unspecified: Secondary | ICD-10-CM

## 2014-12-09 MED ORDER — HYDROCODONE-HOMATROPINE 5-1.5 MG/5ML PO SYRP
5.0000 mL | ORAL_SOLUTION | Freq: Three times a day (TID) | ORAL | Status: AC | PRN
Start: 2014-12-09 — End: ?

## 2014-12-09 NOTE — Telephone Encounter (Signed)
Pt left v/m; pt seen 12/05/14 and tessalon not helping cough pt request refill on hycodan(last written # 120 ml on 04/08/14). Pt request cb.

## 2014-12-09 NOTE — Telephone Encounter (Signed)
[facility] notified prescription is available for pick up at the front desk.

## 2014-12-17 ENCOUNTER — Ambulatory Visit: Payer: 59 | Admitting: Psychology

## 2015-11-19 DIAGNOSIS — M5412 Radiculopathy, cervical region: Secondary | ICD-10-CM | POA: Insufficient documentation

## 2015-11-19 DIAGNOSIS — S4991XA Unspecified injury of right shoulder and upper arm, initial encounter: Secondary | ICD-10-CM | POA: Insufficient documentation

## 2015-11-19 DIAGNOSIS — Z9889 Other specified postprocedural states: Secondary | ICD-10-CM | POA: Insufficient documentation

## 2016-01-13 DIAGNOSIS — G90511 Complex regional pain syndrome I of right upper limb: Secondary | ICD-10-CM | POA: Insufficient documentation

## 2016-02-10 DIAGNOSIS — G43009 Migraine without aura, not intractable, without status migrainosus: Secondary | ICD-10-CM | POA: Insufficient documentation

## 2016-06-15 ENCOUNTER — Ambulatory Visit: Payer: Medicaid Other | Attending: Family Medicine | Admitting: Physical Therapy

## 2016-06-15 DIAGNOSIS — M5412 Radiculopathy, cervical region: Secondary | ICD-10-CM

## 2016-06-15 DIAGNOSIS — M542 Cervicalgia: Secondary | ICD-10-CM | POA: Diagnosis present
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
## 2016-06-15 NOTE — Addendum Note (Signed)
Addended by: Lanell PersonsPAA, JENNIFER L on: 06/15/2016 06:29 PM   Modules accepted: Orders

## 2016-06-15 NOTE — Patient Instructions (Addendum)
Reducing Load   Copyright  VHI. All rights reserved.  BODY MECHANICS Tips Good body mechanics are important during activities of daily living. The practice of good body mechanics will: -help distribute weight throughout the skeleton in a more anatomically correct manner thus stimulating more normal forces on the bones, and encouraging stronger, healthier, denser bones. -reduce unnatural forces on bones, ligaments, joints and muscles and reduce risk of fracture, other injury or back pain. A WORD ON BODY POSITIONING: Sitting is the hardest position for the back. Lying on the back is the easiest. Standing, in good body alignment, is somewhere between. A good motto is: Sit less, stand more, and, when you can't do that, lie down on your back and exercise to strengthen it.  Copyright  VHI. All rights reserved.       Supine to Sit (Active)   Lie on back, left leg bent. Roll to other side. From side-lying, sit up on side of bed. Complete ___ sets of ___ repetitions. Perform ___ sessions per day.  Copyright  VHI. All rights reserved.    Housework - Reaching Down   If you are unable to bend your knees or squat, use a lazy Manuela Schwartz to keep items within easy reach. Store only light, unbreakable items on the lowest shelves, and use a reacher to pick them up.  Copyright  VHI. All rights reserved.  Low Shelf   Squat down, and bring item close to lift.   Copyright  VHI. All rights reserved.  Lifting Principles .Maintain proper posture and head alignment. .Slide object as close as possible before lifting. .Move obstacles out of the way. .Test before lifting; ask for help if too heavy. .Tighten stomach muscles without holding breath. .Use smooth movements; do not jerk. .Use legs to do the work, and pivot with feet. .Distribute the work load symmetrically and close to the center of trunk. .Push instead of pull whenever possible.  Copyright  VHI. All rights reserved.  Posture -  Standing   Good posture is important. Avoid slouching and forward head thrust. Maintain curve in low back and align ears over shoul- ders, hips over ankles.   Copyright  VHI. All rights reserved.   Posture - Sitting   Sit upright, head facing forward. Try using a roll to support lower back. Keep shoulders relaxed, and avoid rounded back. Keep hips level with knees. Avoid crossing legs for long periods.   Copyright  VHI. All rights reserved.  Ideal Posture Use with figures on 3 (2 of 2): 1.Head erect 2.Chin in 3.Chest and navel aligned 4.Spinal curves maintained 5.Knees relaxed 6.Shoulders and hips aligned 7.Feet slightly apart 8.Toes and arches active 9.Abdomen taut (breathe with diaphragm) 10.Arms at sides Ideal posture is: -pain free. -achieved with practice, mindful interest, and body awareness.  Copyright  VHI. All rights reserved.     Move heavy items one at a time, or move portions of the contents.   Posture Awareness     Stand and check posture: Jut chin, pull back to comfortable position. Tilt pelvis forward, back; be sure back is not swayed. Roll from heels to balls of feet, then distribute your weight evenly. Picture a line through spine pulling you erect. Focus on breathing. Good Posture = Better Breathing. Check ____ times per day.  http://gt2.exer.us/873   Copyright  VHI. All rights reserved.    Posture Tips DO: - stand tall and erect - keep chin tucked in - keep head and shoulders in alignment - check posture regularly  in mirror or large window - pull head back against headrest in car seat;  Change your position often.  Sit with lumbar support. DON'T: - slouch or slump while watching TV or reading - sit, stand or lie in one position  for too long;  Sitting is especially hard on the spine so if you sit at a desk/use the computer, then stand up often!   Copyright  VHI. All rights reserved.  Posture - Standing   Good posture is important. Avoid  slouching and forward head thrust. Maintain curve in low back and align ears over shoul- ders, hips over ankles.  Pull your belly button in toward your back bone.   Copyright  VHI. All rights reserved.  Posture - Sitting   Sit upright, head facing forward. Try using a roll to support lower back. Keep shoulders relaxed, and avoid rounded back. Keep hips level with knees. Avoid crossing legs for long periods.   Copyright  VHI. All rights reserved.    Sleeping on Back  Place pillow under knees. A pillow with cervical support and a roll around waist are also helpful. Copyright  VHI. All rights reserved.  Sleeping on Side Place pillow between knees. Use cervical support under neck and a roll around waist as needed. Copyright  VHI. All rights reserved.   Sleeping on Stomach   If this is the only desirable sleeping position, place pillow under lower legs, and under stomach or chest as needed.  Posture - Sitting   Sit upright, head facing forward. Try using a roll to support lower back. Keep shoulders relaxed, and avoid rounded back. Keep hips level with knees. Avoid crossing legs for long periods. Stand to Sit / Sit to Stand   To sit: Bend knees to lower self onto front edge of chair, then scoot back on seat. To stand: Reverse sequence by placing one foot forward, and scoot to front of seat. Use rocking motion to stand up.   Work Height and Reach  Ideal work height is no more than 2 to 4 inches below elbow level when standing, and at elbow level when sitting. Reaching should be limited to arm's length, with elbows slightly bent.  Bending  Bend at hips and knees, not back. Keep feet shoulder-width apart.    Posture - Standing   Good posture is important. Avoid slouching and forward head thrust. Maintain curve in low back and align ears over shoul- ders, hips over ankles.  Alternating Positions   Alternate tasks and change positions frequently to reduce fatigue and  muscle tension. Take rest breaks. Computer Work   Position work to Art gallery manager. Use proper work and seat height. Keep shoulders back and down, wrists straight, and elbows at right angles. Use chair that provides full back support. Add footrest and lumbar roll as needed.  Getting Into / Out of Car  Lower self onto seat, scoot back, then bring in one leg at a time. Reverse sequence to get out.  Dressing  Lie on back to pull socks or slacks over feet, or sit and bend leg while keeping back straight.    Housework - Sink  Place one foot on ledge of cabinet under sink when standing at sink for prolonged periods.   Pushing / Pulling  Pushing is preferable to pulling. Keep back in proper alignment, and use leg muscles to do the work.  Deep Squat   Squat and lift with both arms held against upper trunk. Tighten stomach muscles without holding  breath. Use smooth movements to avoid jerking.  Avoid Twisting   Avoid twisting or bending back. Pivot around using foot movements, and bend at knees if needed when reaching for articles.  Carrying Luggage   Distribute weight evenly on both sides. Use a cart whenever possible. Do not twist trunk. Move body as a unit.   Lifting Principles .Maintain proper posture and head alignment. .Slide object as close as possible before lifting. .Move obstacles out of the way. .Test before lifting; ask for help if too heavy. .Tighten stomach muscles without holding breath. .Use smooth movements; do not jerk. .Use legs to do the work, and pivot with feet. .Distribute the work load symmetrically and close to the center of trunk. .Push instead of pull whenever possible.   Ask For Help   Ask for help and delegate to others when possible. Coordinate your movements when lifting together, and maintain the low back curve.  Log Roll   Lying on back, bend left knee and place left arm across chest. Roll all in one movement to the right. Reverse to roll  to the left. Always move as one unit. Housework - Sweeping  Use long-handled equipment to avoid stooping.   Housework - Wiping  Position yourself as close as possible to reach work surface. Avoid straining your back.  Laundry - Unloading Wash   To unload small items at bottom of washer, lift leg opposite to arm being used to reach.  Gardening - Raking  Move close to area to be raked. Use arm movements to do the work. Keep back straight and avoid twisting.     Cart  When reaching into cart with one arm, lift opposite leg to keep back straight.   Getting Into / Out of Bed  Lower self to lie down on one side by raising legs and lowering head at the same time. Use arms to assist moving without twisting. Bend both knees to roll onto back if desired. To sit up, start from lying on side, and use same move-ments in reverse. Housework - Vacuuming  Hold the vacuum with arm held at side. Step back and forth to move it, keeping head up. Avoid twisting.   Laundry - Armed forces training and education officerLoading Wash  Position laundry basket so that bending and twisting can be avoided.   Laundry - Unloading Dryer  Squat down to reach into clothes dryer or use a reacher.  Gardening - Weeding / Psychiatric nurselanting  Squat or Kneel. Knee pads may be helpful.                   http://gt2.exer.us/30   Scapular Retraction (Standing)   With arms at sides, pinch shoulder blades together. Repeat ___10_ times per set. Do __1__ sets per session. Do ___2_ sessions per day.  http://orth.exer.us/944   Flexibility: Neck Retraction   Pull head straight back, keeping eyes and jaw level. Repeat __5-10__ times per set. Do __1-2__ sets per session. Do __2__ sessions per day.  http://orth.exer.us/344   Posture - Sitting   Sit upright, head facing forward. Try using a roll to support lower back. Keep shoulders relaxed, and avoid rounded back. Keep hips level with knees. Avoid crossing legs for long periods.

## 2016-06-27 DIAGNOSIS — M5030 Other cervical disc degeneration, unspecified cervical region: Secondary | ICD-10-CM | POA: Insufficient documentation

## 2016-11-25 ENCOUNTER — Encounter (HOSPITAL_BASED_OUTPATIENT_CLINIC_OR_DEPARTMENT_OTHER): Payer: Self-pay | Admitting: *Deleted

## 2016-11-25 ENCOUNTER — Emergency Department (HOSPITAL_BASED_OUTPATIENT_CLINIC_OR_DEPARTMENT_OTHER): Payer: Medicaid Other

## 2016-11-25 ENCOUNTER — Emergency Department (HOSPITAL_BASED_OUTPATIENT_CLINIC_OR_DEPARTMENT_OTHER)
Admission: EM | Admit: 2016-11-25 | Discharge: 2016-11-25 | Disposition: A | Discharge status: Home / Self Care | Payer: Medicaid Other | Attending: Emergency Medicine | Admitting: Emergency Medicine

## 2016-11-25 DIAGNOSIS — Z79899 Other long term (current) drug therapy: Secondary | ICD-10-CM | POA: Diagnosis not present

## 2016-11-25 DIAGNOSIS — Z87891 Personal history of nicotine dependence: Secondary | ICD-10-CM | POA: Insufficient documentation

## 2016-11-25 DIAGNOSIS — N132 Hydronephrosis with renal and ureteral calculous obstruction: Secondary | ICD-10-CM | POA: Diagnosis not present

## 2016-11-25 DIAGNOSIS — R1011 Right upper quadrant pain: Secondary | ICD-10-CM | POA: Diagnosis present

## 2016-11-25 LAB — URINALYSIS, ROUTINE W REFLEX MICROSCOPIC

## 2016-11-25 LAB — URINALYSIS, MICROSCOPIC (REFLEX)

## 2016-11-25 LAB — CBC WITH DIFFERENTIAL/PLATELET
Basophils Absolute: 0 10*3/uL (ref 0.0–0.1)
Basophils Relative: 0 %
EOS PCT: 1 %
Eosinophils Absolute: 0.1 10*3/uL (ref 0.0–0.7)
HCT: 41 % (ref 36.0–46.0)
HEMOGLOBIN: 13.1 g/dL (ref 12.0–15.0)
LYMPHS ABS: 3.3 10*3/uL (ref 0.7–4.0)
LYMPHS PCT: 42 %
MCH: 27 pg (ref 26.0–34.0)
MCHC: 32 g/dL (ref 30.0–36.0)
MCV: 84.4 fL (ref 78.0–100.0)
MONOS PCT: 7 %
Monocytes Absolute: 0.5 10*3/uL (ref 0.1–1.0)
NEUTROS PCT: 50 %
Neutro Abs: 3.9 10*3/uL (ref 1.7–7.7)
Platelets: 390 10*3/uL (ref 150–400)
RBC: 4.86 MIL/uL (ref 3.87–5.11)
RDW: 14.5 % (ref 11.5–15.5)
WBC: 7.7 10*3/uL (ref 4.0–10.5)

## 2016-11-25 LAB — COMPREHENSIVE METABOLIC PANEL
ALBUMIN: 3.7 g/dL (ref 3.5–5.0)
ALK PHOS: 71 U/L (ref 38–126)
ALT: 17 U/L (ref 14–54)
AST: 22 U/L (ref 15–41)
Anion gap: 11 (ref 5–15)
BUN: 9 mg/dL (ref 6–20)
CALCIUM: 8.7 mg/dL — AB (ref 8.9–10.3)
CHLORIDE: 106 mmol/L (ref 101–111)
CO2: 24 mmol/L (ref 22–32)
CREATININE: 0.78 mg/dL (ref 0.44–1.00)
GFR calc non Af Amer: >60 mL/min (ref >60)
GLUCOSE: 123 mg/dL — AB (ref 65–99)
Potassium: 3.4 mmol/L — ABNORMAL LOW (ref 3.5–5.1)
SODIUM: 141 mmol/L (ref 135–145)
Total Bilirubin: 0.3 mg/dL (ref 0.3–1.2)
Total Protein: 7.7 g/dL (ref 6.5–8.1)

## 2016-11-25 LAB — PREGNANCY, URINE: Preg Test, Ur: NEGATIVE

## 2016-11-25 LAB — LIPASE, BLOOD: Lipase: 26 U/L (ref 11–51)

## 2016-11-25 MED ORDER — SODIUM CHLORIDE 0.9 % IV BOLUS (SEPSIS)
1000.0000 mL | Freq: Once | INTRAVENOUS | Status: AC
  Administered 2016-11-25: 1000 mL via INTRAVENOUS

## 2016-11-25 MED ORDER — FENTANYL CITRATE (PF) 100 MCG/2ML IJ SOLN
100.0000 ug | Freq: Once | INTRAMUSCULAR | Status: AC
  Administered 2016-11-25: 100 ug via INTRAVENOUS
  Filled 2016-11-25: qty 2

## 2016-11-25 MED ORDER — KETOROLAC TROMETHAMINE 30 MG/ML IJ SOLN
30.0000 mg | Freq: Once | INTRAMUSCULAR | Status: AC
  Administered 2016-11-25: 30 mg via INTRAVENOUS
  Filled 2016-11-25: qty 1

## 2016-11-25 MED ORDER — TAMSULOSIN HCL 0.4 MG PO CAPS: 0.4000 mg | ORAL_CAPSULE | Freq: Every day | ORAL | 0 refills | Status: AC

## 2016-11-25 MED ORDER — SODIUM CHLORIDE 0.9 % IV SOLN
INTRAVENOUS | Status: DC
  Administered 2016-11-25: 17:00:00 via INTRAVENOUS

## 2016-11-25 MED ORDER — ONDANSETRON 4 MG PO TBDP: 4.0000 mg | ORAL_TABLET | Freq: Three times a day (TID) | ORAL | 0 refills | Status: AC | PRN

## 2016-11-25 MED ORDER — PROMETHAZINE HCL 25 MG/ML IJ SOLN
12.5000 mg | Freq: Once | INTRAMUSCULAR | Status: AC
  Administered 2016-11-25: 12.5 mg via INTRAVENOUS
  Filled 2016-11-25: qty 1

## 2016-11-25 MED ORDER — ONDANSETRON HCL 4 MG/2ML IJ SOLN
4.0000 mg | Freq: Once | INTRAMUSCULAR | Status: AC
  Administered 2016-11-25: 4 mg via INTRAVENOUS
  Filled 2016-11-25: qty 2

## 2016-11-25 MED ORDER — KETOROLAC TROMETHAMINE 10 MG PO TABS: 10.0000 mg | ORAL_TABLET | Freq: Four times a day (QID) | ORAL | 0 refills | Status: AC | PRN

## 2016-11-25 MED ORDER — FENTANYL CITRATE (PF) 100 MCG/2ML IJ SOLN
50.0000 ug | Freq: Once | INTRAMUSCULAR | Status: AC
Start: 2016-11-25 — End: 2016-11-25
  Administered 2016-11-25: 50 ug via INTRAVENOUS
  Filled 2016-11-25: qty 2

## 2016-11-25 MED ORDER — HYDROCODONE-ACETAMINOPHEN 5-325 MG PO TABS: 1.0000 | ORAL_TABLET | ORAL | 0 refills | Status: AC | PRN

## 2016-11-25 NOTE — ED Triage Notes (Signed)
Right flank pain. Sudden onset. Hx of kidney stones. Vomiting at triage.

## 2016-11-25 NOTE — ED Provider Notes (Signed)
[facility] Provider Note   CSN: [id number] Arrival date & time: 11/25/16  1455     History   Chief Complaint Chief Complaint  Patient presents with  . Abdominal Pain    HPI [name] is a 36 y.o. female.  Pt presents to the ED today with right sided flank pain.  Pt said pain started suddenly.  She also has vomiting.  She has a hx of kidney stones, but nothing recent.  She is on her period as well.      Past Medical History:  Diagnosis Date  . Generalized headache   . History of kidney stones   . History of panic attacks   . Right carpal tunnel syndrome     Patient Active Problem List   Diagnosis Date Noted  . Chronic insomnia 12/05/2014  . Viral URI with cough 12/05/2014  . PTSD (post-traumatic stress disorder) 10/07/2014  . GAD (generalized anxiety disorder) 10/07/2014  . S/P cervical spinal fusion 03/12/2014  . Headache(784.0) 08/24/2012  . Palpitations 08/17/2012  . Panic attacks 08/17/2012  . THYROID DISORDER 05/15/2007  . DEPRESSION 05/15/2007    Past Surgical History:  Procedure Laterality Date  . ANTERIOR CERVICAL DECOMP/DISCECTOMY FUSION N/A 03/12/2014   Procedure: ACDF C5-7;  Surgeon: Venita Lick, MD;  Location: [facility];  Service: Orthopedics;  Laterality: N/A;  . CARPAL TUNNEL RELEASE Right 07/24/2014   Procedure: RIGHT CARPAL TUNNEL RELEASE;  Surgeon: Bradly Bienenstock, MD;  Location: [facility];  Service: Orthopedics;  Laterality: Right;  . WISDOM TOOTH EXTRACTION  age 89    OB History    No data available       Home Medications    Prior to Admission medications   Medication Sig Start Date End Date Taking? Authorizing Provider  Biotin w/ Vitamins C & E 1250-7.5-7.5 MCG-MG-UNT CHEW Chew by mouth daily.   Yes [provider]  [place] Crane 1/35 1-35 MG-MCG tablet  09/22/14  Yes [provider]  Multiple Vitamin (MULTIVITAMIN) tablet Take 1 tablet by mouth daily.   Yes [provider]  traMADol  (ULTRAM) 50 MG tablet TAKE 1 TABLET BY MOUTH EVERY 6 HOURS AS NEEDED FOR CRAMPS 08/14/14  Yes [provider]  traZODone (DESYREL) 50 MG tablet Take 0.5-1 tablets (25-50 mg total) by mouth at bedtime as needed for sleep. 12/05/14  Yes Bedsole, Amy E, MD  benzonatate (TESSALON) 200 MG capsule Take 1 capsule (200 mg total) by mouth 2 (two) times daily as needed for cough. Patient not taking: Reported on 06/15/2016 12/05/14   Excell Seltzer, MD  escitalopram (LEXAPRO) 10 MG tablet Take 1 tablet (10 mg total) by mouth daily. Patient not taking: Reported on 06/15/2016 10/07/14   Excell Seltzer, MD  HYDROcodone-acetaminophen (NORCO/VICODIN) 5-325 MG tablet Take 1 tablet by mouth every 4 (four) hours as needed. 11/25/16   Jacalyn Lefevre, MD  HYDROcodone-homatropine [facility]) 5-1.5 MG/5ML syrup Take 5 mLs by mouth every 8 (eight) hours as needed for cough. Patient not taking: Reported on 06/15/2016 12/09/14   Excell Seltzer, MD  ketorolac (TORADOL) 10 MG tablet Take 1 tablet (10 mg total) by mouth every 6 (six) hours as needed. 11/25/16   Jacalyn Lefevre, MD  ondansetron (ZOFRAN ODT) 4 MG disintegrating tablet Take 1 tablet (4 mg total) by mouth every 8 (eight) hours as needed. 11/25/16   Jacalyn Lefevre, MD  tamsulosin (FLOMAX) 0.4 MG CAPS capsule Take 1 capsule (0.4 mg total) by mouth daily. 11/25/16  Jacalyn LefevreHaviland, Jozelyn Kuwahara, MD    Family History Family History  Problem Relation Age of Onset  . Hypertension Mother   . Graves' disease Brother     Social History Social History  Substance Use Topics  . Smoking status: Former Smoker    Years: 3.00    Types: Cigarettes    Quit date: 07/20/1997  . Smokeless tobacco: Never Used  . Alcohol use 0.6 oz/week    1 Standard drinks or equivalent per week     Comment: socially     Allergies   Percocet [oxycodone-acetaminophen]; Topamax [topiramate]; and Penicillins   Review of Systems Review of Systems  Gastrointestinal: Positive for abdominal pain, nausea  and vomiting.  Genitourinary: Positive for flank pain.  All other systems reviewed and are negative.    Physical Exam Updated Vital Signs BP 112/66 (BP Location: Left Arm)   Pulse 61   Temp 98.2 F (36.8 C) (Oral)   Resp 18   Ht 5\' 5"  (1.651 m)   Wt 97.5 kg (215 lb)   LMP 11/23/2016   SpO2 100%   BMI 35.78 kg/m   Physical Exam  Constitutional: She is oriented to person, place, and time. She appears well-developed. She appears distressed.  HENT:  Head: Normocephalic and atraumatic.  Right Ear: External ear normal.  Left Ear: External ear normal.  Nose: Nose normal.  Mouth/Throat: Oropharynx is clear and moist.  Eyes: Pupils are equal, round, and reactive to light. Conjunctivae and EOM are normal.  Neck: Normal range of motion. Neck supple.  Cardiovascular: Normal rate, regular rhythm, normal heart sounds and intact distal pulses.   Pulmonary/Chest: Effort normal and breath sounds normal.  Abdominal: Soft. Bowel sounds are normal.  Musculoskeletal: Normal range of motion.  Neurological: She is alert and oriented to person, place, and time.  Skin: Skin is warm.  Psychiatric: She has a normal mood and affect. Her behavior is normal. Judgment and thought content normal.  Nursing note and vitals reviewed.    ED Treatments / Results  Labs (all labs ordered are listed, but only abnormal results are displayed) Labs Reviewed  URINALYSIS, ROUTINE W REFLEX MICROSCOPIC - Abnormal; Notable for the following:       Result Value   Color, Urine RED (*)    APPearance TURBID (*)    Glucose, UA   (*)    Value: TEST NOT REPORTED DUE TO COLOR INTERFERENCE OF URINE PIGMENT   Hgb urine dipstick   (*)    Value: TEST NOT REPORTED DUE TO COLOR INTERFERENCE OF URINE PIGMENT   Bilirubin Urine   (*)    Value: TEST NOT REPORTED DUE TO COLOR INTERFERENCE OF URINE PIGMENT   Ketones, ur   (*)    Value: TEST NOT REPORTED DUE TO COLOR INTERFERENCE OF URINE PIGMENT   Protein, ur   (*)    Value:  TEST NOT REPORTED DUE TO COLOR INTERFERENCE OF URINE PIGMENT   Nitrite   (*)    Value: TEST NOT REPORTED DUE TO COLOR INTERFERENCE OF URINE PIGMENT   Leukocytes, UA   (*)    Value: TEST NOT REPORTED DUE TO COLOR INTERFERENCE OF URINE PIGMENT   All other components within normal limits  COMPREHENSIVE METABOLIC PANEL - Abnormal; Notable for the following:    Potassium 3.4 (*)    Glucose, Bld 123 (*)    Calcium 8.7 (*)    All other components within normal limits  URINALYSIS, MICROSCOPIC (REFLEX) - Abnormal; Notable for the following:  Bacteria, UA RARE (*)    Squamous Epithelial / LPF 0-5 (*)    All other components within normal limits  PREGNANCY, URINE  LIPASE, BLOOD  CBC WITH DIFFERENTIAL/PLATELET    EKG  EKG Interpretation None       Radiology Ct Renal Stone Study  Result Date: 11/25/2016 CLINICAL DATA:  Sudden onset of right flank pain today. EXAM: CT ABDOMEN AND PELVIS WITHOUT CONTRAST TECHNIQUE: Multidetector CT imaging of the abdomen and pelvis was performed following the standard protocol without IV contrast. COMPARISON:  None. FINDINGS: Lower chest: The lung bases are clear of acute process. No pleural effusion or pulmonary lesions. The heart is normal in size. No pericardial effusion. The distal esophagus and aorta are unremarkable. Hepatobiliary: No focal hepatic lesions or intrahepatic biliary dilatation. The gallbladder is normal. No common bile duct dilatation. Pancreas: No mass, inflammation or duct dilatation. Spleen: Normal size.  No focal lesions. Adrenals/Urinary Tract: The adrenal glands appear normal. The left kidney is unremarkable.  No renal or ureteral calculi. The right kidney is low attenuation/edematous and there is moderate right-sided hydronephrosis and right hydroureter down to a 4 x 3 mm obstructing calculus located just above the iliac crest. No distal ureteral calculi or bladder calculi. No worrisome renal lesions or bladder lesions. [place]pole right renal  calculi are noted. Stomach/Bowel: The stomach, duodenum, small bowel and colon are grossly normal without oral contrast. No inflammatory changes, mass lesions or obstructive findings. The terminal ileum and appendix are normal. Vascular/Lymphatic: The aorta is normal in caliber. No atherosclerotic calcifications. Small scattered mesenteric and retroperitoneal lymph nodes but no mass or adenopathy. Reproductive: The uterus and ovaries are unremarkable. Other: No pelvic mass or adenopathy. No free pelvic fluid collections. No inguinal mass or adenopathy. No abdominal wall hernia or subcutaneous lesions. Musculoskeletal: No significant bony findings. IMPRESSION: 1. 4 x 3 mm mid right ureteral calculus causing moderate grade obstruction. 2. Right renal calculi. 3. No worrisome renal or bladder lesions without contrast. Electronically Signed   By: Rudie Meyer M.D.   On: 11/25/2016 16:14    Procedures Procedures (including critical care time)  Medications Ordered in ED Medications  sodium chloride 0.9 % bolus 1,000 mL (0 mLs Intravenous Stopped 11/25/16 1630)    And  0.9 %  sodium chloride infusion ( Intravenous Stopped 11/25/16 1933)  ondansetron (ZOFRAN) injection 4 mg (4 mg Intravenous Given 11/25/16 1528)  fentaNYL (SUBLIMAZE) injection 50 mcg (50 mcg Intravenous Given 11/25/16 1528)  promethazine (PHENERGAN) injection 12.5 mg (12.5 mg Intravenous Given 11/25/16 1619)  fentaNYL (SUBLIMAZE) injection 100 mcg (100 mcg Intravenous Given 11/25/16 1623)  sodium chloride 0.9 % bolus 1,000 mL (0 mLs Intravenous Stopped 11/25/16 1819)  ketorolac (TORADOL) 30 MG/ML injection 30 mg (30 mg Intravenous Given 11/25/16 1701)     Initial Impression / Assessment and Plan / ED Course  I have reviewed the triage vital signs and the nursing notes.  Pertinent labs & imaging results that were available during my care of the patient were reviewed by me and considered in my medical decision making (see chart for  details).    Pt is feeling much better.  She has been able to urinate without pain.  She has been able to tolerate po fluids.  She knows to return if worse and to f/u with urology.  Final Clinical Impressions(s) / ED Diagnoses   Final diagnoses:  Ureteral stone with hydronephrosis    New Prescriptions New Prescriptions   HYDROCODONE-ACETAMINOPHEN (NORCO/VICODIN) 5-325 MG TABLET  Take 1 tablet by mouth every 4 (four) hours as needed.   KETOROLAC (TORADOL) 10 MG TABLET    Take 1 tablet (10 mg total) by mouth every 6 (six) hours as needed.   ONDANSETRON (ZOFRAN ODT) 4 MG DISINTEGRATING TABLET    Take 1 tablet (4 mg total) by mouth every 8 (eight) hours as needed.   TAMSULOSIN (FLOMAX) 0.4 MG CAPS CAPSULE    Take 1 capsule (0.4 mg total) by mouth daily.     Jacalyn Lefevre, MD 11/25/16 [phone]

## 2016-11-25 NOTE — ED Notes (Signed)
States she feels much better. Color is improved. Skin is dry.

## 2017-12-13 DIAGNOSIS — M222X1 Patellofemoral disorders, right knee: Secondary | ICD-10-CM | POA: Insufficient documentation

## 2017-12-13 DIAGNOSIS — M7061 Trochanteric bursitis, right hip: Secondary | ICD-10-CM | POA: Insufficient documentation

## 2017-12-13 DIAGNOSIS — E669 Obesity, unspecified: Secondary | ICD-10-CM | POA: Insufficient documentation

## 2018-02-12 DIAGNOSIS — M65961 Unspecified synovitis and tenosynovitis, right lower leg: Secondary | ICD-10-CM | POA: Insufficient documentation

## 2018-03-29 DIAGNOSIS — M2550 Pain in unspecified joint: Secondary | ICD-10-CM | POA: Insufficient documentation

## 2018-04-23 DIAGNOSIS — F1190 Opioid use, unspecified, uncomplicated: Secondary | ICD-10-CM | POA: Insufficient documentation

## 2019-05-03 DIAGNOSIS — G8929 Other chronic pain: Secondary | ICD-10-CM | POA: Insufficient documentation

## 2019-10-16 ENCOUNTER — Ambulatory Visit
Admission: RE | Admit: 2019-10-16 | Discharge: 2019-10-16 | Disposition: A | Discharge status: Home / Self Care | Payer: Medicaid Other | Source: Ambulatory Visit | Attending: Physician Assistant | Admitting: Physician Assistant

## 2019-10-16 ENCOUNTER — Other Ambulatory Visit: Payer: Self-pay

## 2019-10-16 ENCOUNTER — Other Ambulatory Visit: Payer: Self-pay | Admitting: Physician Assistant

## 2019-10-16 DIAGNOSIS — M542 Cervicalgia: Secondary | ICD-10-CM

## 2020-04-21 ENCOUNTER — Other Ambulatory Visit: Payer: Medicaid Other

## 2020-04-21 DIAGNOSIS — Z20822 Contact with and (suspected) exposure to covid-19: Secondary | ICD-10-CM

## 2020-04-22 LAB — SARS-COV-2, NAA 2 DAY TAT

## 2020-04-22 LAB — NOVEL CORONAVIRUS, NAA: SARS-CoV-2, NAA: DETECTED — AB

## 2021-02-26 DIAGNOSIS — M65321 Trigger finger, right index finger: Secondary | ICD-10-CM | POA: Insufficient documentation

## 2021-05-12 ENCOUNTER — Ambulatory Visit
Admission: EM | Admit: 2021-05-12 | Discharge: 2021-05-12 | Disposition: A | Discharge status: Home / Self Care | Payer: BC Managed Care – PPO

## 2021-05-12 ENCOUNTER — Other Ambulatory Visit: Payer: Self-pay

## 2021-05-12 DIAGNOSIS — M5432 Sciatica, left side: Secondary | ICD-10-CM

## 2021-05-12 MED ORDER — PREDNISONE 20 MG PO TABS: 40.0000 mg | ORAL_TABLET | Freq: Every day | ORAL | 0 refills | Status: AC

## 2021-05-12 NOTE — ED Triage Notes (Signed)
Pt c/o left hip pain that radiates down to left foot that is intermittently tingling and numb. Denies any direct trauma or injury to the area. Described as a sharp pain. Onset yesterday. States has rx for hydrocodone for other purposes and took a tablet to help with this pain without relief.

## 2021-05-12 NOTE — ED Provider Notes (Signed)
[facility]    CSN: [id number] Arrival date & time: 05/12/21  1430      History   Chief Complaint Chief Complaint  Patient presents with   left hip pain    HPI [name] is a 41 y.o. female.   Patient here today for evaluation of left hip pain that radiates into left leg and foot. She reports that movement makes symptoms worse. She has had some tingling in her foot but no true numbness. She has tried OTC meds without significant relief. She has not had any recent injury to her left hip or leg. She denies any back pain.   The history is provided by the patient.   Past Medical History:  Diagnosis Date   Generalized headache    History of kidney stones    History of panic attacks    Right carpal tunnel syndrome     Patient Active Problem List   Diagnosis Date Noted   Chronic insomnia 12/05/2014   Viral URI with cough 12/05/2014   PTSD (post-traumatic stress disorder) 10/07/2014   GAD (generalized anxiety disorder) 10/07/2014   S/P cervical spinal fusion 03/12/2014   Headache(784.0) 08/24/2012   Palpitations 08/17/2012   Panic attacks 08/17/2012   THYROID DISORDER 05/15/2007   DEPRESSION 05/15/2007    Past Surgical History:  Procedure Laterality Date   ANTERIOR CERVICAL DECOMP/DISCECTOMY FUSION N/A 03/12/2014   Procedure: ACDF C5-7;  Surgeon: Melina Schools, MD;  Location: [place];  Service: Orthopedics;  Laterality: N/A;   CARPAL TUNNEL RELEASE Right 07/24/2014   Procedure: RIGHT CARPAL TUNNEL RELEASE;  Surgeon: Iran Planas, MD;  Location: [place];  Service: Orthopedics;  Laterality: Right;   WISDOM TOOTH EXTRACTION  age 37    OB History   No obstetric history on file.      Home Medications    Prior to Admission medications   Medication Sig Start Date End Date Taking? Authorizing Provider  predniSONE (DELTASONE) 20 MG tablet Take 2 tablets (40 mg total) by mouth daily with breakfast for 5 days. 05/12/21 05/17/21 Yes Francene Finders, PA-C  benzonatate (TESSALON) 200 MG capsule Take 1 capsule (200 mg total) by mouth 2 (two) times daily as needed for cough. Patient not taking: Reported on 06/15/2016 12/05/14   Jinny Sanders, MD  Biotin w/ Vitamins C & E 1250-7.5-7.5 MCG-MG-UNT CHEW Chew by mouth daily.    [provider]  escitalopram (LEXAPRO) 10 MG tablet Take 1 tablet (10 mg total) by mouth daily. Patient not taking: Reported on 06/15/2016 10/07/14   Jinny Sanders, MD  ethynodiol-ethinyl estradiol Marliss Coots 1/35) 1-35 MG-MCG tablet Kelnor 1/35 (28) 1 mg-35 mcg tablet  TAKE 1 TABLET BY MOUTH ONCE DAILY    [provider]  HYDROcodone-acetaminophen (NORCO/VICODIN) 5-325 MG tablet Take 1 tablet by mouth every 4 (four) hours as needed. 11/25/16   Isla Pence, MD  HYDROcodone-homatropine [facility]) 5-1.5 MG/5ML syrup Take 5 mLs by mouth every 8 (eight) hours as needed for cough. Patient not taking: Reported on 06/15/2016 12/09/14   Jinny Sanders, MD  [facility] 1/35 1-35 MG-MCG tablet  09/22/14   [provider]  ketorolac (TORADOL) 10 MG tablet Take 1 tablet (10 mg total) by mouth every 6 (six) hours as needed. 11/25/16   Isla Pence, MD  Multiple Vitamin (MULTIVITAMIN) tablet Take 1 tablet by mouth daily.    [provider]  ondansetron (ZOFRAN ODT) 4 MG disintegrating tablet Take 1 tablet (4 mg total)  by mouth every 8 (eight) hours as needed. 11/25/16   Isla Pence, MD  tamsulosin (FLOMAX) 0.4 MG CAPS capsule Take 1 capsule (0.4 mg total) by mouth daily. 11/25/16   Isla Pence, MD  traMADol (ULTRAM) 50 MG tablet TAKE 1 TABLET BY MOUTH EVERY 6 HOURS AS NEEDED FOR CRAMPS 08/14/14   [provider]  traZODone (DESYREL) 50 MG tablet Take 0.5-1 tablets (25-50 mg total) by mouth at bedtime as needed for sleep. 12/05/14   Jinny Sanders, MD    Family History Family History  Problem Relation Age of Onset   Hypertension Mother    Berenice Primas' disease Brother     Social  History Social History   Tobacco Use   Smoking status: Former    Years: 3.00    Types: Cigarettes    Quit date: 07/20/1997    Years since quitting: 23.8   Smokeless tobacco: Never  Substance Use Topics   Alcohol use: Yes    Alcohol/week: 1.0 standard drink    Types: 1 Standard drinks or equivalent per week    Comment: socially   Drug use: No     Allergies   Gabapentin, Percocet [oxycodone-acetaminophen], Topamax [topiramate], and Penicillins   Review of Systems Review of Systems  Constitutional:  Negative for chills and fever.  Eyes:  Negative for discharge and redness.  Musculoskeletal:  Positive for arthralgias and myalgias.  Neurological:  Negative for weakness and numbness.    Physical Exam Triage Vital Signs ED Triage Vitals  Enc Vitals Group     BP      Pulse      Resp      Temp      Temp src      SpO2      Weight      Height      Head Circumference      Peak Flow      Pain Score      Pain Loc      Pain Edu?      Excl. in [place]?    No data found.  Updated Vital Signs BP 138/82 (BP Location: Left Arm)    Pulse 75    Temp 98.2 F (36.8 C) (Oral)    Resp 18    SpO2 97%      Physical Exam Vitals and nursing note reviewed.  Constitutional:      General: She is not in acute distress.    Appearance: Normal appearance. She is not ill-appearing.  HENT:     Head: Normocephalic and atraumatic.  Eyes:     Conjunctiva/sclera: Conjunctivae normal.  Cardiovascular:     Rate and Rhythm: Normal rate.  Pulmonary:     Effort: Pulmonary effort is normal.  Neurological:     Mental Status: She is alert.  Psychiatric:        Mood and Affect: Mood normal.        Behavior: Behavior normal.        Thought Content: Thought content normal.     UC Treatments / Results  Labs (all labs ordered are listed, but only abnormal results are displayed) Labs Reviewed - No data to display  EKG   Radiology No results found.  Procedures Procedures (including critical  care time)  Medications Ordered in UC Medications - No data to display  Initial Impression / Assessment and Plan / UC Course  I have reviewed the triage vital signs and the nursing notes.  Pertinent labs & imaging results  that were available during my care of the patient were reviewed by me and considered in my medical decision making (see chart for details).    Prednisone prescribed for suspected sciatica. Recommended follow up if no improvement or if symptoms worsen in any way.   Final Clinical Impressions(s) / UC Diagnoses   Final diagnoses:  Sciatica of left side   Discharge Instructions   None    ED Prescriptions     Medication Sig Dispense Auth. Provider   predniSONE (DELTASONE) 20 MG tablet Take 2 tablets (40 mg total) by mouth daily with breakfast for 5 days. 10 tablet Francene Finders, PA-C      PDMP not reviewed this encounter.   Francene Finders, PA-C 05/12/21 1600

## 2021-05-18 ENCOUNTER — Encounter: Payer: Self-pay | Admitting: Emergency Medicine

## 2021-05-18 ENCOUNTER — Ambulatory Visit
Admission: EM | Admit: 2021-05-18 | Discharge: 2021-05-18 | Disposition: A | Discharge status: Home / Self Care | Payer: BC Managed Care – PPO | Attending: Urgent Care | Admitting: Urgent Care

## 2021-05-18 ENCOUNTER — Other Ambulatory Visit: Payer: Self-pay

## 2021-05-18 DIAGNOSIS — M5416 Radiculopathy, lumbar region: Secondary | ICD-10-CM

## 2021-05-18 MED ORDER — METHYLPREDNISOLONE ACETATE 80 MG/ML IJ SUSP
80.0000 mg | Freq: Once | INTRAMUSCULAR | Status: AC
  Administered 2021-05-18: 80 mg via INTRAMUSCULAR

## 2021-05-18 NOTE — ED Provider Notes (Signed)
[facility]   MRN: [id number] DOB: 03-23-1981  Subjective:   [name] is a 41 y.o. female presenting for recheck regarding severe left-sided low back pain that radiates into the left thigh and leg.  Has felt tingling up and down the same extremity into the foot.  Denies any fall, trauma, changes to bowel or urinary habits, weakness.  However she is limping when she walks due to the pain.  She was seen a few days ago and was started on an oral prednisone course which helped while she was on steroids but symptoms returned soon thereafter after she completed the course.  She does have an orthopedist but they have managed her neck issues, has a history of cervical spinal fusion.  She does not see a spine specialist elsewhere.  No current facility-administered medications for this encounter.  Current Outpatient Medications:    benzonatate (TESSALON) 200 MG capsule, Take 1 capsule (200 mg total) by mouth 2 (two) times daily as needed for cough. (Patient not taking: Reported on 06/15/2016), Disp: 20 capsule, Rfl: 0   Biotin w/ Vitamins C & E 1250-7.5-7.5 MCG-MG-UNT CHEW, Chew by mouth daily., Disp: , Rfl:    escitalopram (LEXAPRO) 10 MG tablet, Take 1 tablet (10 mg total) by mouth daily. (Patient not taking: Reported on 06/15/2016), Disp: 30 tablet, Rfl: 3   ethynodiol-ethinyl estradiol [place] Cr[place] 1/35) 1-35 MG-MCG tablet, [place] 1/35 (28) 1 mg-35 mcg tablet  TAKE 1 TABLET BY MOUTH ONCE DAILY, Disp: , Rfl:    HYDROcodone-acetaminophen (NORCO/VICODIN) 5-325 MG tablet, Take 1 tablet by mouth every 4 (four) hours as needed., Disp: 15 tablet, Rfl: 0   HYDROcodone-homatropine (HYCODAN) 5-1.5 MG/5ML syrup, Take 5 mLs by mouth every 8 (eight) hours as needed for cough. (Patient not taking: Reported on 06/15/2016), Disp: 120 mL, Rfl: 0   KELNOR 1/35 1-35 MG-MCG tablet, , Disp: , Rfl:    ketorolac (TORADOL) 10 MG tablet, Take 1 tablet (10 mg total) by mouth every 6 (six) hours as needed., Disp:  10 tablet, Rfl: 0   Multiple Vitamin (MULTIVITAMIN) tablet, Take 1 tablet by mouth daily., Disp: , Rfl:    ondansetron (ZOFRAN ODT) 4 MG disintegrating tablet, Take 1 tablet (4 mg total) by mouth every 8 (eight) hours as needed., Disp: 10 tablet, Rfl: 0   tamsulosin (FLOMAX) 0.4 MG CAPS capsule, Take 1 capsule (0.4 mg total) by mouth daily., Disp: 14 capsule, Rfl: 0   traMADol (ULTRAM) 50 MG tablet, TAKE 1 TABLET BY MOUTH EVERY 6 HOURS AS NEEDED FOR CRAMPS, Disp: , Rfl: 0   traZODone (DESYREL) 50 MG tablet, Take 0.5-1 tablets (25-50 mg total) by mouth at bedtime as needed for sleep., Disp: 30 tablet, Rfl: 3   Allergies  Allergen Reactions   Gabapentin Shortness Of Breath    "It closed my throat"    Percocet [Oxycodone-Acetaminophen] Anaphylaxis, Hives and Swelling   Topamax [Topiramate] Other (See Comments)    Dry mouth,chills,hot flashes,nauseated   Penicillins Rash    Past Medical History:  Diagnosis Date   Generalized headache    History of kidney stones    History of panic attacks    Right carpal tunnel syndrome      Past Surgical History:  Procedure Laterality Date   ANTERIOR CERVICAL DECOMP/DISCECTOMY FUSION N/A 03/12/2014   Procedure: ACDF C5-7;  Surgeon: Venita Lick, MD;  [facility];  Service: Orthopedics;  Laterality: N/A;   CARPAL TUNNEL RELEASE Right 07/24/2014   Procedure: RIGHT CARPAL TUNNEL RELEASE;  Surgeon: Bradly Bienenstock, MD;  Location: [facility];  Service: Orthopedics;  Laterality: Right;   WISDOM TOOTH EXTRACTION  age 83    Family History  Problem Relation Age of Onset   Hypertension Mother    Luiz Blare' disease Brother     Social History   Tobacco Use   Smoking status: Former    Years: 3.00    Types: Cigarettes    Quit date: 07/20/1997    Years since quitting: 23.8   Smokeless tobacco: Never  Substance Use Topics   Alcohol use: Yes    Alcohol/week: 1.0 standard drink    Types: 1 Standard drinks or equivalent per week    Comment:  socially   Drug use: No    ROS   Objective:   Vitals: BP 140/76 (BP Location: Left Arm)    Pulse 85    Temp 98.2 F (36.8 C) (Oral)    Resp 18    SpO2 96%   Physical Exam Constitutional:      General: She is not in acute distress.    Appearance: Normal appearance. She is well-developed. She is not ill-appearing, toxic-appearing or diaphoretic.  HENT:     Head: Normocephalic and atraumatic.     Nose: Nose normal.     Mouth/Throat:     Mouth: Mucous membranes are moist.  Eyes:     General: No scleral icterus.       Right eye: No discharge.        Left eye: No discharge.     Extraocular Movements: Extraocular movements intact.  Cardiovascular:     Rate and Rhythm: Normal rate.  Pulmonary:     Effort: Pulmonary effort is normal.  Musculoskeletal:     Lumbar back: Tenderness (left side, no midline) present. No swelling, edema, deformity, signs of trauma, lacerations, spasms or bony tenderness. Decreased range of motion (favors left low back). Positive left straight leg raise test. Negative right straight leg raise test. No scoliosis.  Skin:    General: Skin is warm and dry.  Neurological:     General: No focal deficit present.     Mental Status: She is alert and oriented to person, place, and time.     Motor: No weakness.     Coordination: Coordination normal.     Gait: Gait normal.     Deep Tendon Reflexes: Reflexes normal.  Psychiatric:        Mood and Affect: Mood normal.        Behavior: Behavior normal.        Thought Content: Thought content normal.        Judgment: Judgment normal.    Assessment and Plan :   I have reviewed the PDMP during this encounter.  1. Lumbar radiculopathy    Patient does not have signs of an acute spinal emergency.  Recommended 1 more round of steroids with IM Depo-Medrol 80 mg.  Maintain regular chronic pain medications.  Follow-up with [facility].  I deferred imaging with an x-ray as an MRI is more  appropriate for her physical exam, symptom set.  I discussed this with patient and she was agreeable.  Counseled patient on potential for adverse effects with medications prescribed/recommended today, ER and return-to-clinic precautions discussed, patient verbalized understanding.    Wallis Bamberg, PA-C 05/18/21 1200

## 2021-05-18 NOTE — ED Triage Notes (Signed)
Pt here for continued left hip pain with radiation down left leg; pt was seen last week for same and given prednisone; pt denies any improvement

## 2021-06-16 ENCOUNTER — Ambulatory Visit
Admission: EM | Admit: 2021-06-16 | Discharge: 2021-06-16 | Disposition: A | Discharge status: Home / Self Care | Payer: BC Managed Care – PPO | Attending: Internal Medicine | Admitting: Internal Medicine

## 2021-06-16 ENCOUNTER — Other Ambulatory Visit: Payer: Self-pay

## 2021-06-16 DIAGNOSIS — J069 Acute upper respiratory infection, unspecified: Secondary | ICD-10-CM

## 2021-06-16 DIAGNOSIS — H65192 Other acute nonsuppurative otitis media, left ear: Secondary | ICD-10-CM | POA: Diagnosis not present

## 2021-06-16 MED ORDER — AZITHROMYCIN 500 MG PO TABS: ORAL_TABLET | ORAL | 0 refills | Status: AC

## 2021-06-16 NOTE — ED Provider Notes (Signed)
[facility]    CSN: [id number] Arrival date & time: 06/16/21  0813      History   Chief Complaint Chief Complaint  Patient presents with   Otalgia    left    HPI Caryn Gienger Talarico is a 41 y.o. female.   Patient presents with cough, left ear pain, runny nose, sore throat that started this morning.  Patient has taken Alka-Seltzer cold and flu with minimal improvement in symptoms.  Denies any known sick contacts or fevers.  Denies chest pain, shortness of breath, nausea, vomiting, diarrhea, abdominal pain.  Patient took a COVID test at home this morning that was negative.   Otalgia  Past Medical History:  Diagnosis Date   Generalized headache    History of kidney stones    History of panic attacks    Right carpal tunnel syndrome     Patient Active Problem List   Diagnosis Date Noted   Chronic insomnia 12/05/2014   Viral URI with cough 12/05/2014   PTSD (post-traumatic stress disorder) 10/07/2014   GAD (generalized anxiety disorder) 10/07/2014   S/P cervical spinal fusion 03/12/2014   Headache(784.0) 08/24/2012   Palpitations 08/17/2012   Panic attacks 08/17/2012   THYROID DISORDER 05/15/2007   DEPRESSION 05/15/2007    Past Surgical History:  Procedure Laterality Date   ANTERIOR CERVICAL DECOMP/DISCECTOMY FUSION N/A 03/12/2014   Procedure: ACDF C5-7;  Surgeon: Venita Lick, MD;  Location: [facility] OR;  Service: Orthopedics;  Laterality: N/A;   CARPAL TUNNEL RELEASE Right 07/24/2014   Procedure: RIGHT CARPAL TUNNEL RELEASE;  Surgeon: Bradly Bienenstock, MD;  Location: [facility] [place];  Service: Orthopedics;  Laterality: Right;   WISDOM TOOTH EXTRACTION  age 82    OB History   No obstetric history on file.      Home Medications    Prior to Admission medications   Medication Sig Start Date End Date Taking? Authorizing Provider  azithromycin (ZITHROMAX) 500 MG tablet Take 1 tablet (500 mg total) by mouth daily for 1 day, THEN 0.5 tablets (250 mg  total) daily for 4 days. 06/16/21 06/21/21 Yes Tearia Gibbs, Acie Fredrickson, FNP  benzonatate (TESSALON) 200 MG capsule Take 1 capsule (200 mg total) by mouth 2 (two) times daily as needed for cough. Patient not taking: Reported on 06/15/2016 12/05/14   Excell Seltzer, MD  Biotin w/ Vitamins C & E 1250-7.5-7.5 MCG-MG-UNT CHEW Chew by mouth daily.    [provider]  escitalopram (LEXAPRO) 10 MG tablet Take 1 tablet (10 mg total) by mouth daily. Patient not taking: Reported on 06/15/2016 10/07/14   Excell Seltzer, MD  ethynodiol-ethinyl estradiol [place] 1/35) 1-35 MG-MCG tablet [place] 1/35 (28) 1 mg-35 mcg tablet  TAKE 1 TABLET BY MOUTH ONCE DAILY    [provider]  HYDROcodone-acetaminophen (NORCO/VICODIN) 5-325 MG tablet Take 1 tablet by mouth every 4 (four) hours as needed. 11/25/16   Jacalyn Lefevre, MD  HYDROcodone-homatropine [facility]) 5-1.5 MG/5ML syrup Take 5 mLs by mouth every 8 (eight) hours as needed for cough. Patient not taking: Reported on 06/15/2016 12/09/14   Excell Seltzer, MD  [facility] 1/35 1-35 MG-MCG tablet  09/22/14   [provider]  ketorolac (TORADOL) 10 MG tablet Take 1 tablet (10 mg total) by mouth every 6 (six) hours as needed. 11/25/16   Jacalyn Lefevre, MD  Multiple Vitamin (MULTIVITAMIN) tablet Take 1 tablet by mouth daily.    [provider]  ondansetron (ZOFRAN ODT) 4 MG disintegrating tablet Take 1 tablet (  4 mg total) by mouth every 8 (eight) hours as needed. 11/25/16   Jacalyn Lefevre, MD  tamsulosin (FLOMAX) 0.4 MG CAPS capsule Take 1 capsule (0.4 mg total) by mouth daily. 11/25/16   Jacalyn Lefevre, MD  traMADol (ULTRAM) 50 MG tablet TAKE 1 TABLET BY MOUTH EVERY 6 HOURS AS NEEDED FOR CRAMPS 08/14/14   [provider]  traZODone (DESYREL) 50 MG tablet Take 0.5-1 tablets (25-50 mg total) by mouth at bedtime as needed for sleep. 12/05/14   Excell Seltzer, MD    Family History Family History  Problem Relation Age of Onset   Hypertension Mother     Luiz Blare' disease Brother     Social History Social History   Tobacco Use   Smoking status: Former    Years: 3.00    Types: Cigarettes    Quit date: 07/20/1997    Years since quitting: 23.9   Smokeless tobacco: Never  Substance Use Topics   Alcohol use: Yes    Alcohol/week: 1.0 standard drink    Types: 1 Standard drinks or equivalent per week    Comment: socially   Drug use: No     Allergies   Gabapentin, Percocet [oxycodone-acetaminophen], Topamax [topiramate], and Penicillins   Review of Systems Review of Systems Per HPI  Physical Exam Triage Vital Signs ED Triage Vitals  Enc Vitals Group     BP 06/16/21 0823 (!) 141/84     Pulse Rate 06/16/21 0823 84     Resp 06/16/21 0823 18     Temp 06/16/21 0823 98.5 F (36.9 C)     Temp Source 06/16/21 0823 Oral     SpO2 06/16/21 0823 96 %     Weight --      Height --      Head Circumference --      Peak Flow --      Pain Score 06/16/21 0824 8     Pain Loc --      Pain Edu? --      Excl. in GC? --    No data found.  Updated Vital Signs BP (!) 141/84 (BP Location: Right Arm)    Pulse 84    Temp 98.5 F (36.9 C) (Oral)    Resp 18    SpO2 96%   Visual Acuity Right Eye Distance:   Left Eye Distance:   Bilateral Distance:    Right Eye Near:   Left Eye Near:    Bilateral Near:     Physical Exam Constitutional:      General: She is not in acute distress.    Appearance: Normal appearance. She is not toxic-appearing or diaphoretic.  HENT:     Head: Normocephalic and atraumatic.     Right Ear: Tympanic membrane and ear canal normal.     Left Ear: Ear canal normal. No tenderness.  No middle ear effusion. There is no impacted cerumen. No mastoid tenderness. Tympanic membrane is erythematous. Tympanic membrane is not perforated or bulging.     Nose: Congestion present.     Mouth/Throat:     Mouth: Mucous membranes are moist.     Pharynx: No posterior oropharyngeal erythema.  Eyes:     Extraocular Movements:  Extraocular movements intact.     Conjunctiva/sclera: Conjunctivae normal.     Pupils: Pupils are equal, round, and reactive to light.  Cardiovascular:     Rate and Rhythm: Normal rate and regular rhythm.     Pulses: Normal pulses.     Heart  sounds: Normal heart sounds.  Pulmonary:     Effort: Pulmonary effort is normal. No respiratory distress.     Breath sounds: Normal breath sounds. No stridor. No wheezing, rhonchi or rales.  Abdominal:     General: Abdomen is flat. Bowel sounds are normal.     Palpations: Abdomen is soft.  Musculoskeletal:        General: Normal range of motion.     Cervical back: Normal range of motion.  Skin:    General: Skin is warm and dry.  Neurological:     General: No focal deficit present.     Mental Status: She is alert and oriented to person, place, and time. Mental status is at baseline.  Psychiatric:        Mood and Affect: Mood normal.        Behavior: Behavior normal.     UC Treatments / Results  Labs (all labs ordered are listed, but only abnormal results are displayed) Labs Reviewed - No data to display  EKG   Radiology No results found.  Procedures Procedures (including critical care time)  Medications Ordered in UC Medications - No data to display  Initial Impression / Assessment and Plan / UC Course  I have reviewed the triage vital signs and the nursing notes.  Pertinent labs & imaging results that were available during my care of the patient were reviewed by me and considered in my medical decision making (see chart for details).     Patient presents with symptoms likely from a viral upper respiratory infection. Differential includes bacterial pneumonia, sinusitis, allergic rhinitis, COVID-19, flu. Do not suspect underlying cardiopulmonary process. Symptoms seem unlikely related to ACS, CHF or COPD exacerbations, pneumonia, pneumothorax. Patient is nontoxic appearing and not in need of emergent medical intervention.   Suggested viral testing for COVID and flu but patient declined.  Low suspicion for strep throat.   Recommended symptom control with over the counter medications: Daily oral anti-histamine, Oral decongestant or IN corticosteroid, saline irrigations, cepacol lozenges, Robitussin, Delsym, honey tea.  Will treat with azithromycin for left ear infection.  Return if symptoms fail to improve in 1-2 weeks or you develop shortness of breath, chest pain, severe headache. Patient states understanding and is agreeable.  Discharged with PCP followup.  Final Clinical Impressions(s) / UC Diagnoses   Final diagnoses:  Other non-recurrent acute nonsuppurative otitis media of left ear  Viral upper respiratory tract infection with cough     Discharge Instructions      You have an ear infection which is being treated with antibiotics.  It also appears that you have a viral upper respiratory infection that should self resolve in the next few days with symptomatic treatment.    ED Prescriptions     Medication Sig Dispense Auth. Provider   azithromycin (ZITHROMAX) 500 MG tablet Take 1 tablet (500 mg total) by mouth daily for 1 day, THEN 0.5 tablets (250 mg total) daily for 4 days. 3 tablet [place], Acie Fredrickson, [place]      PDMP not reviewed this encounter.   Gustavus Bryant, [place] 06/16/21 [phone]

## 2021-06-16 NOTE — ED Triage Notes (Signed)
Onset earlier this morning of cough, left ear pain, runny nose, sore throat with pain on swallowing. Has been taking alka seltzer w/o relief. No v/d. No right ear sxs. Negative at home covid test.  ?

## 2021-06-16 NOTE — Discharge Instructions (Signed)
You have an ear infection which is being treated with antibiotics.  It also appears that you have a viral upper respiratory infection that should self resolve in the next few days with symptomatic treatment. ?

## 2022-05-05 IMAGING — CR DG CERVICAL SPINE 2 OR 3 VIEWS
4 series · 4 of 4 positions shown · non-contrast
Comparison: None.

CLINICAL DATA: Cervicalgia

EXAM:
CERVICAL SPINE - 2-3 VIEW

[w cervical spine lat]
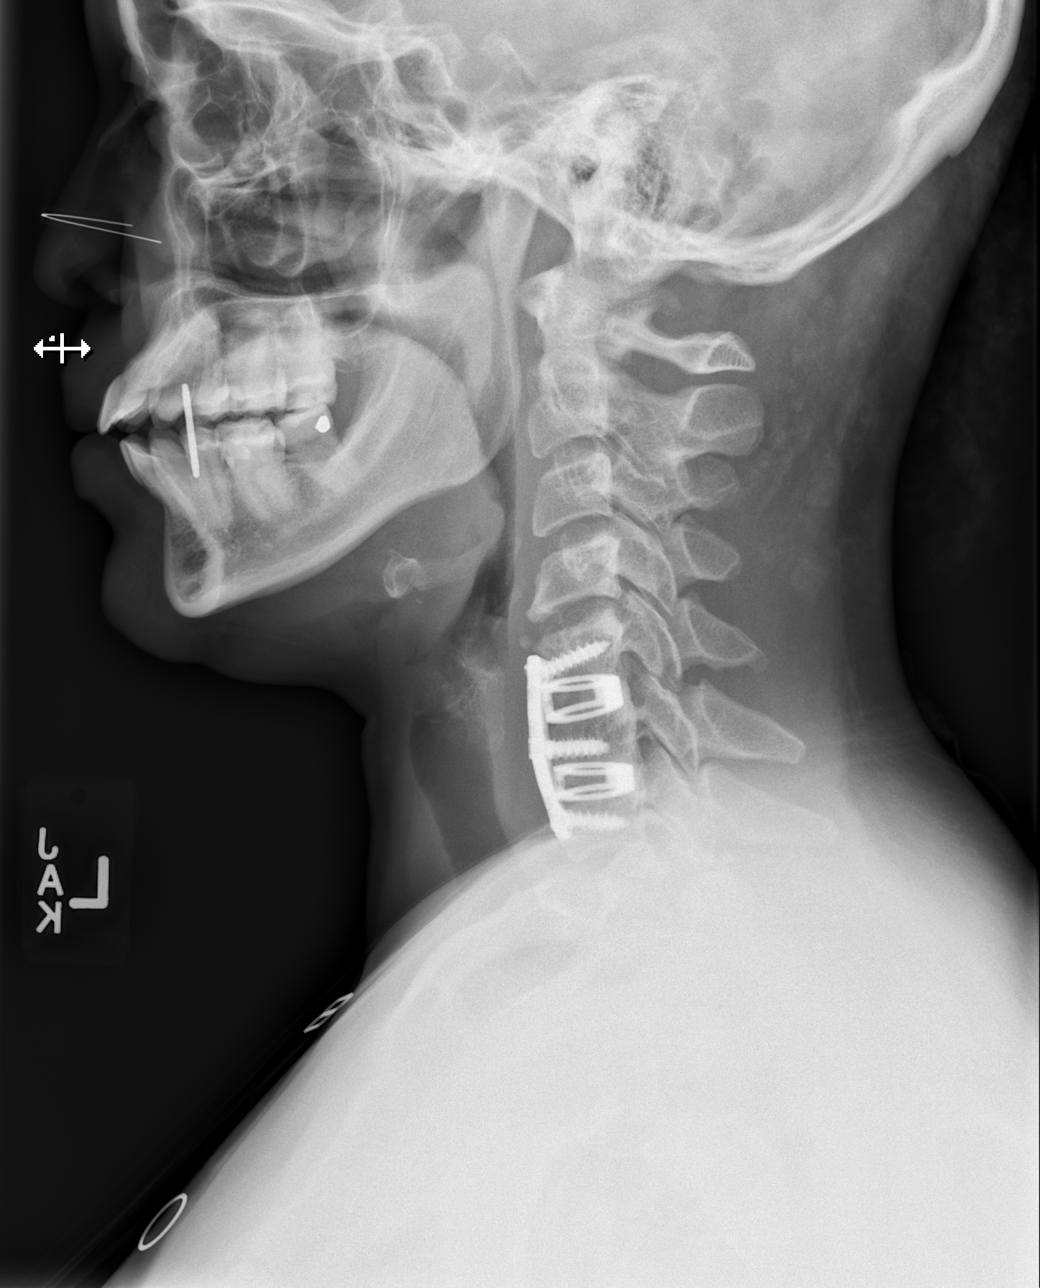

[w cervical spine ap]
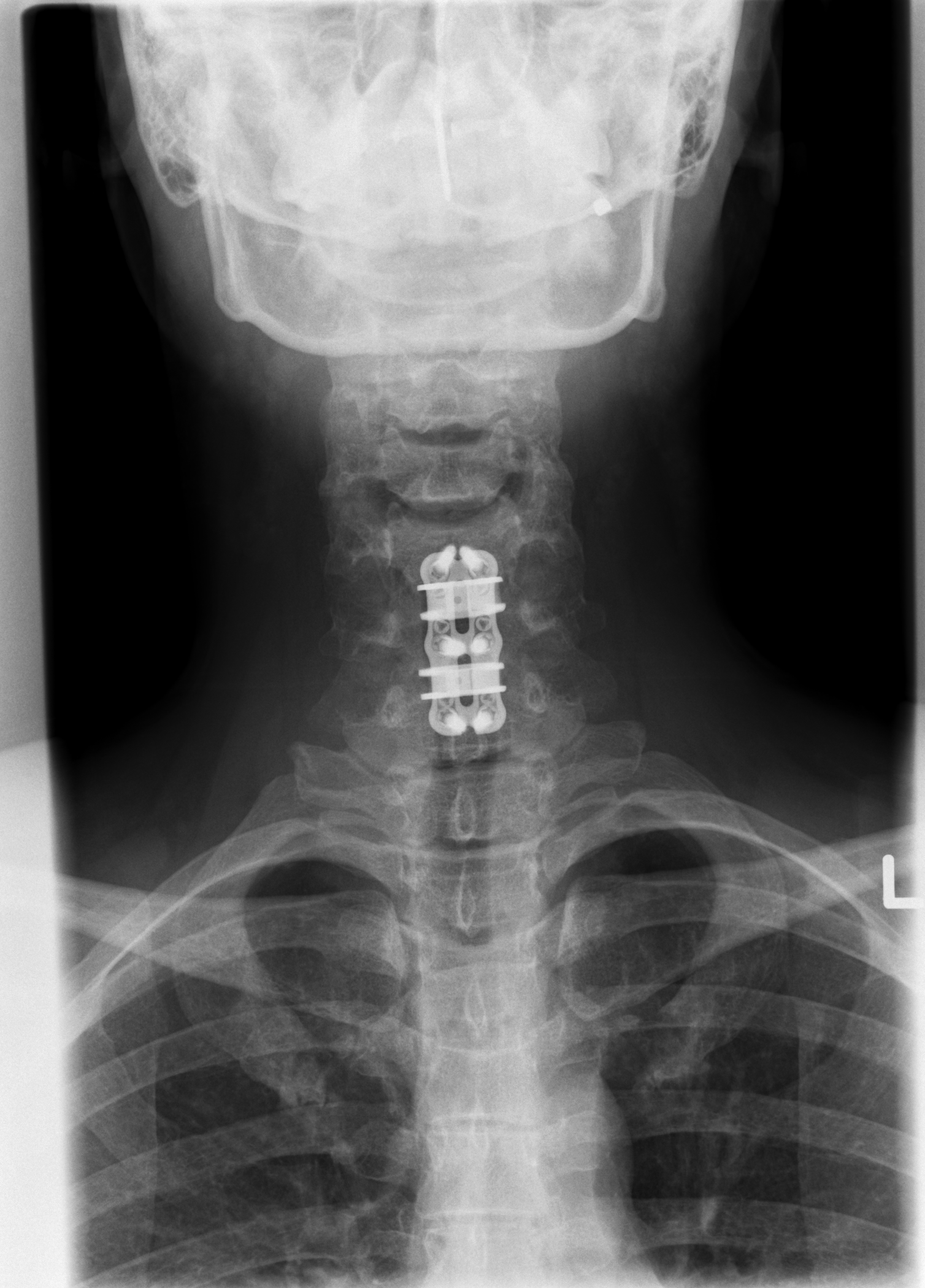

[w cervical spine odontoid (1 of 2)]
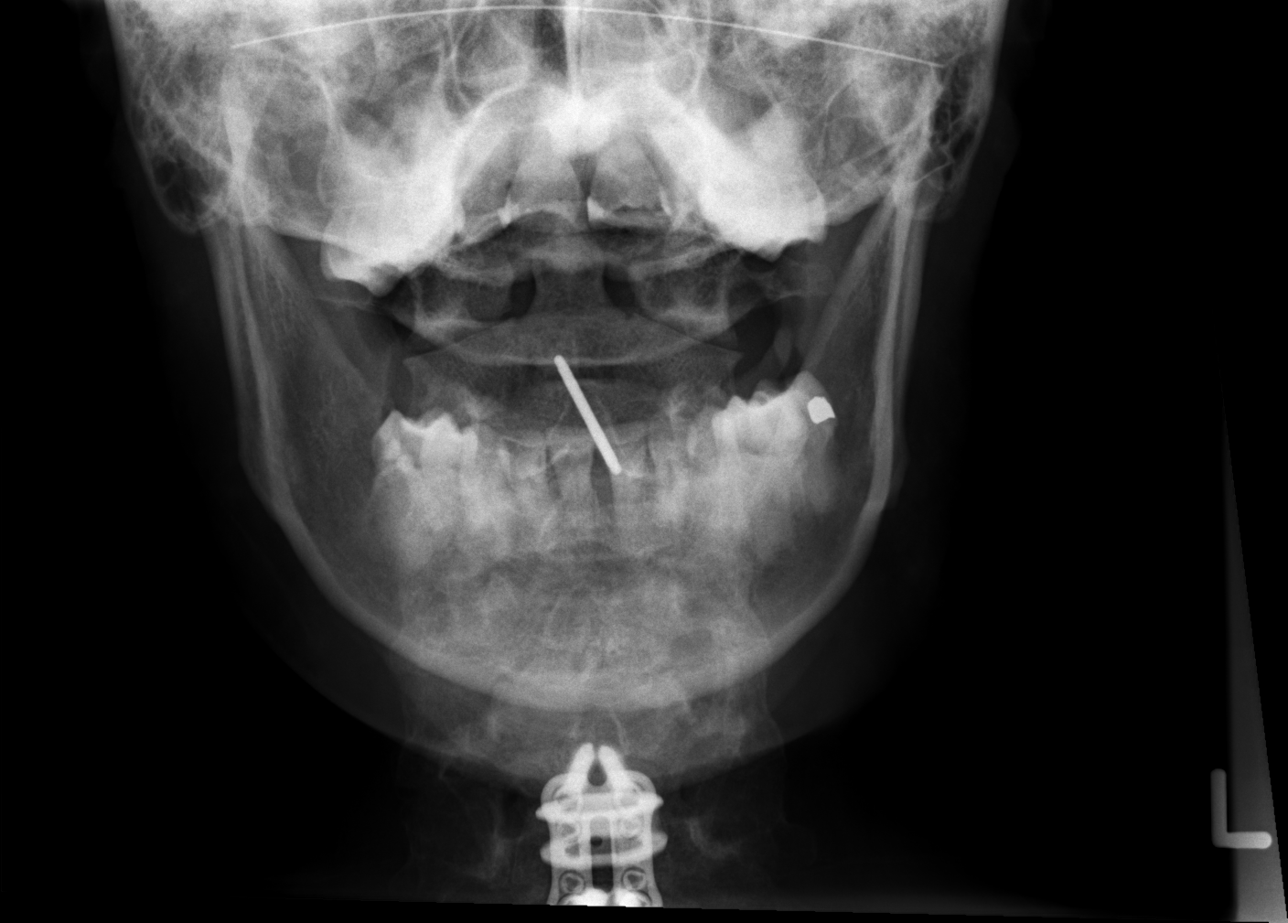

[w cervical spine odontoid (2 of 2)]
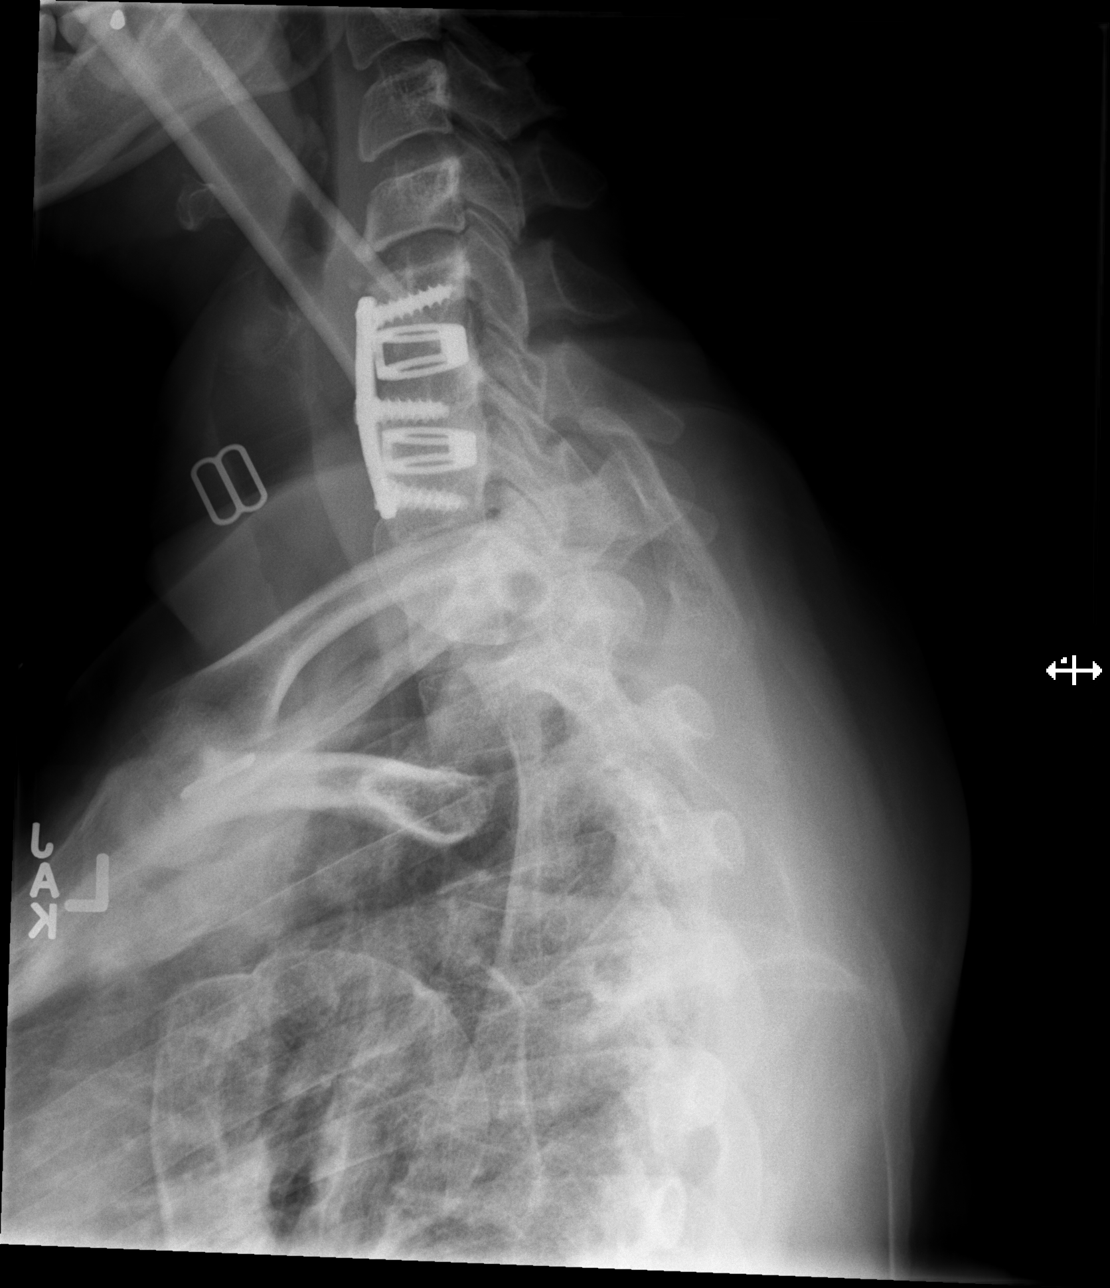

[4 of 4 positions shown; findings below may reference images not displayed]

FINDINGS: Frontal, lateral, and open-mouth odontoid images obtained. Patient
is status post anterior screw and plate fixation from C5-C7 with
disc spacers at C5-6 and C6-7. No fracture or spondylolisthesis.
Prevertebral soft tissues and predental space regions are normal.
The disc spaces appear unremarkable. No erosive changes. Lung apices
are clear.
IMPRESSION: Postoperative change from C5-C7 with support hardware intact. No
fracture or spondylolisthesis. No appreciable disc space narrowing.

## 2022-12-13 ENCOUNTER — Ambulatory Visit
Admission: EM | Admit: 2022-12-13 | Discharge: 2022-12-13 | Disposition: A | Discharge status: Home / Self Care | Payer: BC Managed Care – PPO | Attending: Physician Assistant | Admitting: Physician Assistant

## 2022-12-13 DIAGNOSIS — S90861A Insect bite (nonvenomous), right foot, initial encounter: Secondary | ICD-10-CM | POA: Diagnosis not present

## 2022-12-13 DIAGNOSIS — S90862A Insect bite (nonvenomous), left foot, initial encounter: Secondary | ICD-10-CM | POA: Diagnosis not present

## 2022-12-13 DIAGNOSIS — U071 COVID-19: Secondary | ICD-10-CM | POA: Diagnosis not present

## 2022-12-13 DIAGNOSIS — S90869A Insect bite (nonvenomous), unspecified foot, initial encounter: Secondary | ICD-10-CM

## 2022-12-13 DIAGNOSIS — W57XXXA Bitten or stung by nonvenomous insect and other nonvenomous arthropods, initial encounter: Secondary | ICD-10-CM | POA: Diagnosis not present

## 2022-12-13 DIAGNOSIS — Z20822 Contact with and (suspected) exposure to covid-19: Secondary | ICD-10-CM | POA: Diagnosis present

## 2022-12-13 DIAGNOSIS — Z87891 Personal history of nicotine dependence: Secondary | ICD-10-CM | POA: Insufficient documentation

## 2022-12-13 LAB — SARS CORONAVIRUS 2 (TAT 6-24 HRS): SARS Coronavirus 2: POSITIVE — AB

## 2022-12-13 MED ORDER — DOXYCYCLINE HYCLATE 100 MG PO CAPS: 100.0000 mg | ORAL_CAPSULE | Freq: Two times a day (BID) | ORAL | 0 refills | Status: AC

## 2022-12-13 NOTE — ED Provider Notes (Signed)
[facility]    CSN: [id number] Arrival date & time: 12/13/22  0901      History   Chief Complaint Chief Complaint  Patient presents with   Insect Bite   COVID19 Testing    HPI [name] is a 42 y.o. female.   Patient here today for COVID screening as she has had exposure at work.  She currently denies any symptoms.  She does report ant bites over her feet a few days ago.  She has used topical treatment.  She has not had any fever.  The history is provided by the patient.    Past Medical History:  Diagnosis Date   Generalized headache    History of kidney stones    History of panic attacks    Right carpal tunnel syndrome     Patient Active Problem List   Diagnosis Date Noted   Chronic insomnia 12/05/2014   Viral URI with cough 12/05/2014   PTSD (post-traumatic stress disorder) 10/07/2014   GAD (generalized anxiety disorder) 10/07/2014   S/P cervical spinal fusion 03/12/2014   Headache(784.0) 08/24/2012   Palpitations 08/17/2012   Panic attacks 08/17/2012   THYROID DISORDER 05/15/2007   DEPRESSION 05/15/2007    Past Surgical History:  Procedure Laterality Date   ANTERIOR CERVICAL DECOMP/DISCECTOMY FUSION N/A 03/12/2014   Procedure: ACDF C5-7;  Surgeon: Venita Lick, MD;  Location: [facility] OR;  Service: Orthopedics;  Laterality: N/A;   CARPAL TUNNEL RELEASE Right 07/24/2014   Procedure: RIGHT CARPAL TUNNEL RELEASE;  Surgeon: Bradly Bienenstock, MD;  Location: [facility];  Service: Orthopedics;  Laterality: Right;   WISDOM TOOTH EXTRACTION  age 36    OB History   No obstetric history on file.      Home Medications    Prior to Admission medications   Medication Sig Start Date End Date Taking? Authorizing Provider  doxycycline (VIBRAMYCIN) 100 MG capsule Take 1 capsule (100 mg total) by mouth 2 (two) times daily. 12/13/22  Yes Tomi Bamberger, PA-C  ethynodiol-ethinyl estradiol [place] 1/35) 1-35 MG-MCG tablet [place] 1/35 (28) 1  mg-35 mcg tablet  TAKE 1 TABLET BY MOUTH ONCE DAILY   Yes [provider]  benzonatate (TESSALON) 200 MG capsule Take 1 capsule (200 mg total) by mouth 2 (two) times daily as needed for cough. Patient not taking: Reported on 06/15/2016 12/05/14   Excell Seltzer, MD  Biotin w/ Vitamins C & E 1250-7.5-7.5 MCG-MG-UNT CHEW Chew by mouth daily.    [provider]  escitalopram (LEXAPRO) 10 MG tablet Take 1 tablet (10 mg total) by mouth daily. Patient not taking: Reported on 06/15/2016 10/07/14   Excell Seltzer, MD  HYDROcodone-acetaminophen (NORCO/VICODIN) 5-325 MG tablet Take 1 tablet by mouth every 4 (four) hours as needed. 11/25/16   Jacalyn Lefevre, MD  HYDROcodone-homatropine [place]) 5-1.5 MG/5ML syrup Take 5 mLs by mouth every 8 (eight) hours as needed for cough. Patient not taking: Reported on 06/15/2016 12/09/14   Excell Seltzer, MD  [facility] 1/35 1-35 MG-MCG tablet  09/22/14   [provider]  ketorolac (TORADOL) 10 MG tablet Take 1 tablet (10 mg total) by mouth every 6 (six) hours as needed. 11/25/16   Jacalyn Lefevre, MD  Multiple Vitamin (MULTIVITAMIN) tablet Take 1 tablet by mouth daily.    [provider]  ondansetron (ZOFRAN ODT) 4 MG disintegrating tablet Take 1 tablet (4 mg total) by mouth every 8 (eight) hours as needed. 11/25/16   Jacalyn Lefevre, MD  tamsulosin (FLOMAX) 0.4 MG CAPS capsule Take 1 capsule (0.4 mg total) by mouth daily. 11/25/16   Jacalyn Lefevre, MD  traMADol (ULTRAM) 50 MG tablet TAKE 1 TABLET BY MOUTH EVERY 6 HOURS AS NEEDED FOR CRAMPS 08/14/14   [provider]  traZODone (DESYREL) 50 MG tablet Take 0.5-1 tablets (25-50 mg total) by mouth at bedtime as needed for sleep. 12/05/14   Excell Seltzer, MD    Family History Family History  Problem Relation Age of Onset   Hypertension Mother    Luiz Blare' disease Brother     Social History Social History   Tobacco Use   Smoking status: Former    Current packs/day: 0.00    Types:  Cigarettes    Start date: 07/21/1994    Quit date: 07/20/1997    Years since quitting: 25.4   Smokeless tobacco: Never  Vaping Use   Vaping status: Never Used  Substance Use Topics   Alcohol use: Yes    Alcohol/week: 1.0 standard drink of alcohol    Types: 1 Standard drinks or equivalent per week    Comment: socially   Drug use: No     Allergies   Gabapentin, Oxycodone-acetaminophen, Percocet [oxycodone-acetaminophen], Topiramate, Diclofenac sodium, Tizanidine, Penicillins, and Sertraline   Review of Systems Review of Systems  Constitutional:  Negative for chills and fever.  HENT:  Negative for congestion, ear pain and sore throat.   Eyes:  Negative for discharge and redness.  Respiratory:  Negative for cough, shortness of breath and wheezing.   Gastrointestinal:  Negative for diarrhea, nausea and vomiting.  Skin:  Positive for color change. Negative for wound.     Physical Exam Triage Vital Signs ED Triage Vitals  Encounter Vitals Group     BP 12/13/22 0957 105/69     Systolic BP Percentile --      Diastolic BP Percentile --      Pulse Rate 12/13/22 0957 89     Resp 12/13/22 0957 18     Temp 12/13/22 0957 98 F (36.7 C)     Temp Source 12/13/22 0957 Oral     SpO2 12/13/22 0957 98 %     Weight 12/13/22 0954 204 lb (92.5 kg)     Height 12/13/22 0954 5\' 5"  (1.651 m)     Head Circumference --      Peak Flow --      Pain Score 12/13/22 0953 10     Pain Loc --      Pain Education --      Exclude from Growth Chart --    No data found.  Updated Vital Signs BP 105/69 (BP Location: Left Arm)   Pulse 89   Temp 98 F (36.7 C) (Oral)   Resp 18   Ht 5\' 5"  (1.651 m)   Wt 204 lb (92.5 kg)   LMP 11/23/2022 (Exact Date)   SpO2 98%   BMI 33.95 kg/m      Physical Exam Vitals and nursing note reviewed.  Constitutional:      General: She is not in acute distress.    Appearance: Normal appearance. She is not ill-appearing.  HENT:     Head: Normocephalic and  atraumatic.     Nose: No congestion.     Mouth/Throat:     Mouth: Mucous membranes are moist.  Eyes:     Conjunctiva/sclera: Conjunctivae normal.  Cardiovascular:     Rate and Rhythm: Normal rate and regular rhythm.     Heart sounds: Normal  heart sounds. No murmur heard. Pulmonary:     Effort: Pulmonary effort is normal. No respiratory distress.     Breath sounds: Normal breath sounds. No wheezing, rhonchi or rales.  Skin:    General: Skin is warm and dry.     Comments: Scattered papular lesions that are mildly erythematous to bilateral feet- single more erythematous, swollen papule to left toes  Neurological:     Mental Status: She is alert.  Psychiatric:        Mood and Affect: Mood normal.        Thought Content: Thought content normal.      UC Treatments / Results  Labs (all labs ordered are listed, but only abnormal results are displayed) Labs Reviewed  SARS CORONAVIRUS 2 (TAT 6-24 HRS) - Abnormal; Notable for the following components:      Result Value   SARS Coronavirus 2 POSITIVE (*)    All other components within normal limits    EKG   Radiology No results found.  Procedures Procedures (including critical care time)  Medications Ordered in UC Medications - No data to display  Initial Impression / Assessment and Plan / UC Course  I have reviewed the triage vital signs and the nursing notes.  Pertinent labs & imaging results that were available during my care of the patient were reviewed by me and considered in my medical decision making (see chart for details).    COVID screening ordered.  Will await results further recommendation.  Advised to continue topical steroid treatment for insect bites.  Doxycycline prescribed to cover possible cellulitis given worsening erythema and swelling to left foot bite . Recommend follow-up if no gradual improvement with any further concerns.  Final Clinical Impressions(s) / UC Diagnoses   Final diagnoses:  Exposure  to COVID-19 virus  Insect bite of foot, unspecified laterality, initial encounter   Discharge Instructions   None    ED Prescriptions     Medication Sig Dispense Auth. Provider   doxycycline (VIBRAMYCIN) 100 MG capsule Take 1 capsule (100 mg total) by mouth 2 (two) times daily. 20 capsule Tomi Bamberger, PA-C      PDMP not reviewed this encounter.   Tomi Bamberger, PA-C 12/16/22 1023

## 2022-12-13 NOTE — ED Triage Notes (Signed)
+   Exposure to COVID19 at job "and I want tested".   Insect bites/ant's over the weekend to both feet.

## 2022-12-16 ENCOUNTER — Encounter: Payer: Self-pay | Admitting: Physician Assistant

## 2023-05-25 ENCOUNTER — Encounter: Payer: Self-pay | Admitting: Emergency Medicine

## 2023-05-25 ENCOUNTER — Ambulatory Visit
Admission: EM | Admit: 2023-05-25 | Discharge: 2023-05-25 | Discharge status: Against Medical Advice | Payer: BC Managed Care – PPO

## 2023-05-25 NOTE — ED Triage Notes (Signed)
 Pt reports R knee pain with slight swelling x5 days. No trauma or injury to area. No recurrent hx of pain to area or significant medical conditions. Pt reports she woke up with mild pain that has gradually worsened and become sharp pain. It is affecting mobility - limping gait. Has turned into sharp pain that will radiate down into toes with some numbness and tingling. Aggravated by twisting motions and direct pressure. No relief with ice or ibuprofen .
